# Patient Record
Sex: Female | Born: 1975 | Hispanic: No | Smoking: Current every day smoker
Health system: Southern US, Community
[De-identification: ages and names within clinical notes are randomized; demographics above are authoritative.]

## PROBLEM LIST (undated history)

## (undated) DIAGNOSIS — F419 Anxiety disorder, unspecified: Secondary | ICD-10-CM

## (undated) DIAGNOSIS — R42 Dizziness and giddiness: Secondary | ICD-10-CM

## (undated) DIAGNOSIS — R569 Unspecified convulsions: Secondary | ICD-10-CM

## (undated) DIAGNOSIS — F32A Depression, unspecified: Secondary | ICD-10-CM

## (undated) HISTORY — DX: Anxiety disorder, unspecified: F41.9

## (undated) HISTORY — PX: TUBAL LIGATION: SHX77

## (undated) HISTORY — PX: ESOPHAGUS SURGERY: SHX626

## (undated) HISTORY — DX: Unspecified convulsions: R56.9

## (undated) HISTORY — DX: Depression, unspecified: F32.A

## (undated) HISTORY — PX: FOOT SURGERY: SHX648

---

## 2021-01-11 DIAGNOSIS — Z720 Tobacco use: Secondary | ICD-10-CM | POA: Insufficient documentation

## 2021-01-11 DIAGNOSIS — F319 Bipolar disorder, unspecified: Secondary | ICD-10-CM | POA: Insufficient documentation

## 2021-01-11 DIAGNOSIS — F1920 Other psychoactive substance dependence, uncomplicated: Secondary | ICD-10-CM | POA: Insufficient documentation

## 2021-01-11 DIAGNOSIS — R569 Unspecified convulsions: Secondary | ICD-10-CM | POA: Insufficient documentation

## 2021-04-28 ENCOUNTER — Encounter (HOSPITAL_COMMUNITY): Payer: Self-pay

## 2021-04-28 ENCOUNTER — Emergency Department (HOSPITAL_COMMUNITY)
Admission: EM | Admit: 2021-04-28 | Discharge: 2021-04-28 | Disposition: A | Discharge status: Home / Self Care | Payer: PRIVATE HEALTH INSURANCE | Attending: Emergency Medicine | Admitting: Emergency Medicine

## 2021-04-28 ENCOUNTER — Other Ambulatory Visit (HOSPITAL_COMMUNITY): Payer: Self-pay

## 2021-04-28 DIAGNOSIS — Y990 Civilian activity done for income or pay: Secondary | ICD-10-CM | POA: Diagnosis not present

## 2021-04-28 DIAGNOSIS — M79662 Pain in left lower leg: Secondary | ICD-10-CM | POA: Diagnosis not present

## 2021-04-28 DIAGNOSIS — W010XXA Fall on same level from slipping, tripping and stumbling without subsequent striking against object, initial encounter: Secondary | ICD-10-CM | POA: Diagnosis not present

## 2021-04-28 DIAGNOSIS — M7918 Myalgia, other site: Secondary | ICD-10-CM | POA: Diagnosis not present

## 2021-04-28 DIAGNOSIS — W19XXXA Unspecified fall, initial encounter: Secondary | ICD-10-CM

## 2021-04-28 DIAGNOSIS — R52 Pain, unspecified: Secondary | ICD-10-CM | POA: Diagnosis present

## 2021-04-28 MED ORDER — IBUPROFEN 600 MG PO TABS
600.0000 mg | ORAL_TABLET | Freq: Four times a day (QID) | ORAL | 0 refills | Status: DC | PRN
  Filled 2021-04-28: qty 30, 8d supply, fill #0

## 2021-04-28 MED ORDER — LIDOCAINE 5 % EX PTCH
1.0000 | MEDICATED_PATCH | CUTANEOUS | 0 refills | Status: DC
  Filled 2021-04-28: qty 30, 30d supply, fill #0

## 2021-04-28 NOTE — Discharge Instructions (Signed)
Use the ibuprofen as needed for pain and inflammation.  You may also use Tylenol.  Lidocaine patches have been sent to the pharmacy as well.  All of these may be purchased over-the-counter in the event that your insurance does not cover them.  Your work note is attached.

## 2021-04-28 NOTE — ED Triage Notes (Signed)
Pt arrived via POV, c/o slip and fall at workplace. C/o left shoulder and left leg pain. Ambulatory in triage.

## 2021-04-28 NOTE — ED Provider Notes (Signed)
[facility]-EMERGENCY DEPT Provider Note   CSN: [id number] Arrival date & time: 04/28/21  1003     History  Chief Complaint  Patient presents with   Lacey Miles Lacey Miles is a 46 y.o. female presenting after slip and fall yesterday at work.  Patient is a Scientist, research (life sciences) and someone was mopping the floor and did not put a wet floor side and the patient ended up slipping and falling onto her back and bottom.  Denies hitting her head or losing consciousness.  Was ambulatory after the event.  Denies any numbness or tingling.  Complaining of pain in the left side of her body.  Concerned about being able to work.  Has not taken any medications for this      Home Medications Prior to Admission medications   Medication Sig Start Date End Date Taking? Authorizing Provider  ibuprofen (ADVIL) 600 MG tablet Take 1 tablet (600 mg total) by mouth every 6 (six) hours as needed. 04/28/21  Yes Laqueta Bonaventura A, PA-C  lidocaine (LIDODERM) 5 % Place 1 patch onto the skin daily. Remove & Discard patch within 12 hours or as directed by MD 04/28/21  Yes Johnnetta Holstine A, PA-C      Allergies    Patient has no allergy information on record.    Review of Systems   Review of Systems  Physical Exam Updated Vital Signs BP 118/76 (BP Location: Right Arm)    Pulse 76    Temp (!) 97.4 F (36.3 C) (Oral)    Resp 16    SpO2 100%  Physical Exam Vitals and nursing note reviewed.  Constitutional:      Appearance: Normal appearance.  HENT:     Head: Normocephalic and atraumatic.  Eyes:     General: No scleral icterus.    Conjunctiva/sclera: Conjunctivae normal.  Pulmonary:     Effort: Pulmonary effort is normal. No respiratory distress.  Musculoskeletal:        General: Tenderness (Tenderness localized to quadriceps, hamstrings and glutes of left lower extremity.  No tenderness over the knee or over the hip.  No tenderness to the left shoulder, elbow or wrist.  Patient  was able to stand and ambulate, had somewhat of a limp.) present. No swelling, deformity or signs of injury.  Skin:    General: Skin is warm and dry.     Findings: No rash.  Neurological:     Mental Status: She is alert.  Psychiatric:        Mood and Affect: Mood normal.    ED Results / Procedures / Treatments   Labs (all labs ordered are listed, but only abnormal results are displayed) Labs Reviewed - No data to display  EKG None  Radiology No results found.  Procedures Procedures   Medications Ordered in ED Medications - No data to display  ED Course/ Medical Decision Making/ A&P                           Medical Decision Making Risk Prescription drug management.   46 year old female presenting after a fall yesterday.  She reports that she fell onto her back and left side.  Complaining of left extremity pain.  On physical exam, all tenderness was localized to the muscles.  No bony tenderness, no x-rays needed at this time.  We talked about appropriate over-the-counter treatment.  I have sent 600 mg ibuprofens to her pharmacy for her  to use if needed.  Lidocaine patches will also be there.  Patient requesting work note, this is attached to her discharge papers.   Final Clinical Impression(s) / ED Diagnoses Final diagnoses:  Fall, initial encounter    Rx / DC Orders ED Discharge Orders          Ordered    lidocaine (LIDODERM) 5 %  Every 24 hours        04/28/21 1214    ibuprofen (ADVIL) 600 MG tablet  Every 6 hours PRN        04/28/21 1214           Results and diagnoses were explained to the patient. Return precautions discussed in full. Patient had no additional questions and expressed complete understanding.   This chart was dictated using voice recognition software.  Despite best efforts to proofread,  errors can occur which can change the documentation meaning.    Woodroe Chen 04/28/21 1240    Jacalyn Lefevre, MD 04/28/21 1505

## 2021-07-20 ENCOUNTER — Encounter: Payer: Self-pay | Admitting: Nurse Practitioner

## 2021-07-20 ENCOUNTER — Ambulatory Visit (INDEPENDENT_AMBULATORY_CARE_PROVIDER_SITE_OTHER): Payer: 59 | Admitting: Nurse Practitioner

## 2021-07-20 VITALS — BP 90/58 | HR 98 | Temp 98.0°F | Resp 12 | Ht 70.0 in | Wt 137.4 lb

## 2021-07-20 DIAGNOSIS — F319 Bipolar disorder, unspecified: Secondary | ICD-10-CM | POA: Diagnosis not present

## 2021-07-20 DIAGNOSIS — F1920 Other psychoactive substance dependence, uncomplicated: Secondary | ICD-10-CM

## 2021-07-20 DIAGNOSIS — Z720 Tobacco use: Secondary | ICD-10-CM | POA: Diagnosis not present

## 2021-07-20 DIAGNOSIS — G43109 Migraine with aura, not intractable, without status migrainosus: Secondary | ICD-10-CM

## 2021-07-20 DIAGNOSIS — F419 Anxiety disorder, unspecified: Secondary | ICD-10-CM | POA: Insufficient documentation

## 2021-07-20 DIAGNOSIS — R569 Unspecified convulsions: Secondary | ICD-10-CM

## 2021-07-20 LAB — POCT URINALYSIS DIPSTICK
Bilirubin, UA: NEGATIVE
Blood, UA: NEGATIVE
Glucose, UA: NEGATIVE
Ketones, UA: NEGATIVE
Leukocytes, UA: NEGATIVE
Nitrite, UA: NEGATIVE
Protein, UA: NEGATIVE
Spec Grav, UA: 1.015 (ref 1.010–1.025)
Urobilinogen, UA: 0.2 E.U./dL
pH, UA: 7.5 (ref 5.0–8.0)

## 2021-07-20 MED ORDER — SUMATRIPTAN SUCCINATE 25 MG PO TABS: 25.0000 mg | ORAL_TABLET | Freq: Once | ORAL | 0 refills | Status: DC

## 2021-07-20 MED ORDER — HYDROXYZINE HCL 10 MG PO TABS: 10.0000 mg | ORAL_TABLET | Freq: Three times a day (TID) | ORAL | 0 refills | Status: DC | PRN

## 2021-07-20 NOTE — Assessment & Plan Note (Signed)
Patient currently not managed by any behavioral health specialist.  Did change her hydroxyzine from 50 mg 4 times daily to 10 mg 3 times daily as patient states she cannot tolerate the amount of hydroxyzine.  We will have her referred to psychiatry for further evaluation and follow-up with her bipolar and possible schizophrenia. ?

## 2021-07-20 NOTE — Patient Instructions (Signed)
Nice to see you today  ?I want to see you in 4-5 months for your physical ?I will be in touch with the lab results ?I also placed the referrals that we talked about ?

## 2021-07-20 NOTE — Assessment & Plan Note (Signed)
History of the same per last ER visit was on Keppra 750 mg twice daily and Depakote 500 mg extended release twice daily patient also on topiramate 50 mg twice daily.  States she does have a medication list but is concerned this could expire soon will have an urgent referral to neurology for further management last seizure was approximately 2 months ago.  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 ?

## 2021-07-20 NOTE — Assessment & Plan Note (Signed)
Cervical diagnosis.  Patient states that she is on Topamax for seizures not headache prophylaxis.  Does have a history of using sumatriptan hand for abortive therapy.  Patient is also on medication.  Will renew sumatriptan hand for patient and this can also be evaluated by neurology along with her seizure disorder. ?

## 2021-07-20 NOTE — Assessment & Plan Note (Signed)
PHQ-9 and GAD-7 administered in office.  GAD-7 score was elevated at 17.  Patient does have a duloxetine but states she cannot tolerate 50 mg 4 times a day will lower the dose hydroxyzine 10 mg 3 times daily as needed referral for psychiatry placed ?

## 2021-07-20 NOTE — Assessment & Plan Note (Signed)
Currently everyday smoker.  She is interested in quitting did provide her with 1 800 quit now information on discharge paperwork. ?

## 2021-07-20 NOTE — Progress Notes (Signed)
? ?New Patient Office Visit ? ?Subjective   ? ?Patient ID: Lacey Miles, female    DOB: 07/02/1975  Age: 46 y.o. MRN: [id number] ? ?CC:  ?Chief Complaint  ?Patient presents with  ? Establish Care  ?  Previous PCP [facility] [place], and also [facility]  in [place], [facility].  ? ? ?HPI ?Lacey Miles presents to establish care ? ? ?Seizure: states that she has not seen a neurology in approx 1 year.  States the last time she had a seizure was approx approx 2 months. She would stretch medication because of cost. Started after her head went through the window ? ?Bipolar 1: use to see [facility] in the past. States that she used to be followed and approx last time was 1 year ago. States that she ?No Hi/Si/AVH. History of visual hallucinationa dn auditory hallicunations. States that she will hear mumbling voices but no command.  ?States that she sees ghost, but is unsure if that is something that other people can do or see if that is part of her hallucinations. ? ?Polysubstance: would smoke and snort crack/cocaine and drink a little alocohol a the time.  States she has been sober for 1 year.  She is not in any support group such as NA.  Commended her on her sobriety ? ?Tobacco: States that she has tried cold [place] in the past without help. Has not tired other methods ? ?Immunizations: ?-Tetanus: up to date ?-Influenza: out of season ?-Covid-19: Up to date ?-Shingles: Too young ?-Pneumonia: Too young ? ?Diet: Fair diet. States she eats all the time. Snacks. All but coffee. ?Exercise: No regular exercise. House hold and yard work ? ?Eye exam: needs updating ?Dental exam: needs updating ? ?Pap Smear: DUE ?Mammogram: DUE ?Colonoscopy: Too young ?Dexa: Too young ? ? ?LMP: having a heavier period and becoming irregular. ? ?Lung Cancer Screening: NA ? ?Headaches: left side of head. States that it has been daily this past day. States that tylenol came  make it go away. Light and sound and n/v vision goes "[place]" ? ?Sleep: states will go to bed around 12am and get up around 6am. Does not feel rested. Does snore ? ?Outpatient Encounter Medications as of 07/20/2021  ?Medication Sig  ? divalproex (DEPAKOTE ER) 500 MG 24 hr tablet Take 500 mg by mouth daily.  ? hydrOXYzine (ATARAX) 10 MG tablet Take 1 tablet (10 mg total) by mouth 3 (three) times daily as needed.  ? levETIRAcetam (KEPPRA) 750 MG tablet Take 750 mg by mouth 2 (two) times daily.  ? topiramate (TOPAMAX) 50 MG tablet Take 50 mg by mouth daily.  ? traZODone (DESYREL) 100 MG tablet Take 1 tablet by mouth at bedtime.  ? [DISCONTINUED] hydrOXYzine (VISTARIL) 50 MG capsule Take 50 mg by mouth 4 (four) times daily as needed.  ? [DISCONTINUED] SUMAtriptan (IMITREX) 25 MG tablet Take by mouth.  ? SUMAtriptan (IMITREX) 25 MG tablet Take 1 tablet (25 mg total) by mouth once for 1 dose. May repeat in 2 hours if needed  ? [DISCONTINUED] ibuprofen (ADVIL) 600 MG tablet Take 1 tablet (600 mg total) by mouth every 6 (six) hours as needed.  ? [DISCONTINUED] lidocaine (LIDODERM) 5 % Place 1 patch onto the skin daily. Remove & Discard patch within 12 hours or as directed by MD  ? ?No facility-administered encounter medications on file as of 07/20/2021.  ? ? ?Past Medical History:  ?Diagnosis Date  ? Anxiety   ?  Depression   ? Seizures (HCC)   ? ? ?Past Surgical History:  ?Procedure Laterality Date  ? ESOPHAGUS SURGERY    ? as a baby, due to esophagus was very tiny-had to put an artifical part in esophagus so it would stay open  ? FOOT SURGERY    ? as a baby-had to break bones in legs to fix her feet/legs. legs were bended inwards  ? TUBAL LIGATION    ? ? ?Family History  ?Problem Relation Age of Onset  ? Heart disease Mother   ? Stroke Mother   ? Diabetes Mother   ? Asthma Father   ? Heart disease Father   ?     open heart surgery  ? Stroke Father   ? Diabetes Father   ? Asthma Brother   ? Brain cancer Maternal Grandmother    ? Emphysema Maternal Grandfather   ? Cancer Paternal Grandmother   ?     unsure of the type  ? ? ?Social History  ? ?Socioeconomic History  ? Marital status: Divorced  ?  Spouse name: Not on file  ? Number of children: 3  ? Years of education: Not on file  ? Highest education level: Not on file  ?Occupational History  ? Not on file  ?Tobacco Use  ? Smoking status: Every Day  ?  Packs/day: 0.50  ?  Years: 30.00  ?  Pack years: 15.00  ?  Types: Cigarettes  ? Smokeless tobacco: Never  ?Vaping Use  ? Vaping Use: Former  ?Substance and Sexual Activity  ? Alcohol use: Not Currently  ? Drug use: Not Currently  ?  Types: "Crack" cocaine  ?  Comment: quit in 2022  ? Sexual activity: Not on file  ?Other Topics Concern  ? Not on file  ?Social History Narrative  ? Fulltime: Conservation officer, nature for [employer]  ? ?Social Determinants of Health  ? ?Financial Resource Strain: Not on file  ?Food Insecurity: Not on file  ?Transportation Needs: Not on file  ?Physical Activity: Not on file  ?Stress: Not on file  ?Social Connections: Not on file  ?Intimate Partner Violence: Not on file  ? ? ?Review of Systems  ?Constitutional:  Positive for malaise/fatigue. Negative for chills and fever.  ?Respiratory:  Negative for shortness of breath (DOE with smoking).   ?Cardiovascular:  Negative for chest pain and leg swelling.  ?Gastrointestinal:  Negative for abdominal pain, diarrhea, nausea and vomiting.  ?     BM daily ?  ?Genitourinary:  Negative for dysuria and hematuria.  ?     Nocturia intermittent  ?Neurological:  Positive for seizures and headaches. Negative for tingling.  ? ?  ? ? ?Objective   ? ?BP (!) 90/58   Pulse 98   Temp 98 ?F (36.7 ?C)   Resp 12   Ht 5\' 10"  (1.778 m)   Wt 137 lb 6 oz (62.3 kg)   LMP 07/17/2021   SpO2 97%   BMI 19.71 kg/m?  ? ?Physical Exam ?Vitals and nursing note reviewed.  ?Constitutional:   ?   Appearance: Normal appearance.  ?HENT:  ?   Right Ear: Tympanic membrane, ear canal and external ear normal.  ?   Left  Ear: Tympanic membrane, ear canal and external ear normal.  ?Cardiovascular:  ?   Rate and Rhythm: Normal rate and regular rhythm.  ?   Pulses: Normal pulses.  ?   Heart sounds: Normal heart sounds.  ?Pulmonary:  ?   Effort: Pulmonary  effort is normal.  ?   Breath sounds: Normal breath sounds.  ?Abdominal:  ?   General: Bowel sounds are normal.  ?   Tenderness: There is abdominal tenderness.  ?Musculoskeletal:  ?   Right lower leg: No edema.  ?   Left lower leg: No edema.  ?Lymphadenopathy:  ?   Cervical: No cervical adenopathy.  ?Skin: ?   General: Skin is warm.  ?Neurological:  ?   General: No focal deficit present.  ?   Mental Status: She is alert.  ?   Deep Tendon Reflexes:  ?   Reflex Scores: ?     Bicep reflexes are 2+ on the right side and 2+ on the left side. ?     Patellar reflexes are 2+ on the right side and 2+ on the left side. ?   Comments: Bilateral upper and lower extremity strength 5/5  ?Psychiatric:     ?   Mood and Affect: Mood normal.     ?   Behavior: Behavior normal.     ?   Thought Content: Thought content normal.     ?   Judgment: Judgment normal.  ? ? ? ?  ? ?Assessment & Plan:  ? ?Problem List Items Addressed This Visit   ? ?  ? Cardiovascular and Mediastinum  ? Migraine with aura and without status migrainosus, not intractable  ?  Cervical diagnosis.  Patient states that she is on Topamax for seizures not headache prophylaxis.  Does have a history of using sumatriptan hand for abortive therapy.  Patient is also on medication.  Will renew sumatriptan hand for patient and this can also be evaluated by neurology along with her seizure disorder. ? ?  ?  ? Relevant Medications  ? divalproex (DEPAKOTE ER) 500 MG 24 hr tablet  ? levETIRAcetam (KEPPRA) 750 MG tablet  ? topiramate (TOPAMAX) 50 MG tablet  ? traZODone (DESYREL) 100 MG tablet  ? SUMAtriptan (IMITREX) 25 MG tablet  ?  ? Other  ? Bipolar 1 disorder (HCC) - Primary  ?  Patient currently not managed by any behavioral health specialist.   Did change her hydroxyzine from 50 mg 4 times daily to 10 mg 3 times daily as patient states she cannot tolerate the amount of hydroxyzine.  We will have her referred to psychiatry for further evaluation and follow-u

## 2021-07-20 NOTE — Assessment & Plan Note (Signed)
Patient reports that she used to use crack and cocaine.  States she has been sober for the past year ?

## 2021-07-21 LAB — LIPID PANEL
Cholesterol: 143 mg/dL (ref <200)
HDL: 45 mg/dL — ABNORMAL LOW (ref >=50)
LDL Cholesterol (Calc): 71 mg/dL (calc)
Non-HDL Cholesterol (Calc): 98 mg/dL (calc) (ref <130)
Total CHOL/HDL Ratio: 3.2 (calc) (ref <5.0)
Triglycerides: 199 mg/dL — ABNORMAL HIGH (ref <150)

## 2021-07-21 LAB — CBC WITH DIFFERENTIAL/PLATELET
Absolute Monocytes: 545 cells/uL (ref 200–950)
Basophils Absolute: 59 cells/uL (ref 0–200)
Basophils Relative: 0.6 %
Eosinophils Absolute: 644 cells/uL — ABNORMAL HIGH (ref 15–500)
Eosinophils Relative: 6.5 %
HCT: 40.1 % (ref 35.0–45.0)
Hemoglobin: 13.3 g/dL (ref 11.7–15.5)
Lymphs Abs: 2861 cells/uL (ref 850–3900)
MCH: 31.1 pg (ref 27.0–33.0)
MCHC: 33.2 g/dL (ref 32.0–36.0)
MCV: 93.9 fL (ref 80.0–100.0)
MPV: 13.9 fL — ABNORMAL HIGH (ref 7.5–12.5)
Monocytes Relative: 5.5 %
Neutro Abs: 5792 cells/uL (ref 1500–7800)
Neutrophils Relative %: 58.5 %
Platelets: 146 10*3/uL (ref 140–400)
RBC: 4.27 10*6/uL (ref 3.80–5.10)
RDW: 13.4 % (ref 11.0–15.0)
Total Lymphocyte: 28.9 %
WBC: 9.9 10*3/uL (ref 3.8–10.8)

## 2021-07-21 LAB — COMPREHENSIVE METABOLIC PANEL
AG Ratio: 1.9 (calc) (ref 1.0–2.5)
ALT: 9 U/L (ref 6–29)
AST: 9 U/L — ABNORMAL LOW (ref 10–30)
Albumin: 4.3 g/dL (ref 3.6–5.1)
Alkaline phosphatase (APISO): 64 U/L (ref 31–125)
BUN: 11 mg/dL (ref 7–25)
CO2: 23 mmol/L (ref 20–32)
Calcium: 10.3 mg/dL — ABNORMAL HIGH (ref 8.6–10.2)
Chloride: 107 mmol/L (ref 98–110)
Creat: 0.91 mg/dL (ref 0.50–0.99)
Globulin: 2.3 g/dL (calc) (ref 1.9–3.7)
Glucose, Bld: 80 mg/dL (ref 65–99)
Potassium: 4.3 mmol/L (ref 3.5–5.3)
Sodium: 140 mmol/L (ref 135–146)
Total Bilirubin: 0.3 mg/dL (ref 0.2–1.2)
Total Protein: 6.6 g/dL (ref 6.1–8.1)

## 2021-07-21 LAB — TSH: TSH: 0.86 mIU/L

## 2021-07-24 ENCOUNTER — Telehealth: Payer: Self-pay

## 2021-07-24 NOTE — Telephone Encounter (Signed)
See lab results notes. I  called again but voicemail is not set up.  ?

## 2021-07-24 NOTE — Telephone Encounter (Signed)
[facility] Night - Client ?Nonclinical Telephone Record  ?AccessNurse? ?Client [facility] Night - Client ?Client Site [facility] - Night ?Provider AA - PHYSICIAN, UNKNOWN- MD ?Contact Type Call ?Who Is Calling Patient / Member / Family / Caregiver ?Caller Name Lacey Miles ?Caller Phone Number [phone] ?Call Type Message Only Information Provided ?Reason for Call Returning a Call from the Office ?Initial Comment Caller states she had a missed call from the office today. She is a new pt so she isn't sure of the ?doctors name. Address was verified. ?Additional Comment Caller is returning had a missed call today while she was at work. ?Disp. Time Disposition Final User ?07/23/2021 5:06:28 PM General Information Provided Yes Baker Pierini ?Call Closed By: Baker Pierini ?Transaction Date/Time: 07/23/2021 5:03:45 PM (ET ?

## 2021-07-27 ENCOUNTER — Ambulatory Visit (INDEPENDENT_AMBULATORY_CARE_PROVIDER_SITE_OTHER): Payer: 59 | Admitting: Family

## 2021-07-27 ENCOUNTER — Encounter: Payer: Self-pay | Admitting: Neurology

## 2021-07-27 ENCOUNTER — Encounter: Payer: Self-pay | Admitting: Family

## 2021-07-27 VITALS — BP 112/68 | HR 73 | Temp 98.3°F | Resp 16 | Ht 70.0 in | Wt 137.4 lb

## 2021-07-27 DIAGNOSIS — J0110 Acute frontal sinusitis, unspecified: Secondary | ICD-10-CM | POA: Insufficient documentation

## 2021-07-27 DIAGNOSIS — R350 Frequency of micturition: Secondary | ICD-10-CM | POA: Insufficient documentation

## 2021-07-27 DIAGNOSIS — Z20822 Contact with and (suspected) exposure to covid-19: Secondary | ICD-10-CM

## 2021-07-27 DIAGNOSIS — H8111 Benign paroxysmal vertigo, right ear: Secondary | ICD-10-CM

## 2021-07-27 DIAGNOSIS — R0789 Other chest pain: Secondary | ICD-10-CM | POA: Diagnosis not present

## 2021-07-27 DIAGNOSIS — R12 Heartburn: Secondary | ICD-10-CM | POA: Diagnosis not present

## 2021-07-27 DIAGNOSIS — R5383 Other fatigue: Secondary | ICD-10-CM

## 2021-07-27 DIAGNOSIS — R42 Dizziness and giddiness: Secondary | ICD-10-CM

## 2021-07-27 LAB — POCT URINALYSIS DIP (CLINITEK)
Bilirubin, UA: NEGATIVE
Blood, UA: NEGATIVE
Glucose, UA: NEGATIVE mg/dL
Ketones, POC UA: NEGATIVE mg/dL
Nitrite, UA: NEGATIVE
POC PROTEIN,UA: NEGATIVE
Spec Grav, UA: 1.01 (ref 1.010–1.025)
Urobilinogen, UA: 0.2 E.U./dL
pH, UA: 8 (ref 5.0–8.0)

## 2021-07-27 LAB — POC COVID19 BINAXNOW: SARS Coronavirus 2 Ag: NEGATIVE

## 2021-07-27 MED ORDER — OMEPRAZOLE 20 MG PO CPDR: 20.0000 mg | DELAYED_RELEASE_CAPSULE | Freq: Every day | ORAL | 3 refills | Status: DC

## 2021-07-27 MED ORDER — MECLIZINE HCL 25 MG PO TABS: ORAL_TABLET | ORAL | 0 refills | Status: DC

## 2021-07-27 MED ORDER — AMOXICILLIN-POT CLAVULANATE 875-125 MG PO TABS: 1.0000 | ORAL_TABLET | Freq: Two times a day (BID) | ORAL | 0 refills | Status: DC

## 2021-07-27 NOTE — Assessment & Plan Note (Signed)
Rise slowly upon standing or sitting up  rx meclizine 25 mg, take 1/2 to 1 tablet qhs prn vertigo If no improvement x one week consider referral to ent for epley maneuver Handout given on vertigo to pt and sent via mychart  

## 2021-07-27 NOTE — Patient Instructions (Signed)
It was a pleasure seeing you today.   You were found to have a urinary tract infection, you have been prescribed an antibiotic to your preferred pharmacy. Please start antibiotic today as directed.   We are sending your urine for a culture to make sure you do not have a resistant bacteria. We will call you if we need to change your medications.   Please make sure you are drinking plenty of fluids over the next few days.  If your symptoms do not improve over the next 5-7 days, or if they worsen, please let us know. Please also let us know if you have worsening back pain, fevers, chills, or body aches.   Regards,   Daymon Hora  

## 2021-07-27 NOTE — Assessment & Plan Note (Addendum)
covid negative ?Orthostatic there is drop of blood pressure/pulse , drop of systolic 20 mmhg however ?Pulse did increase upon standing from 68 to 96 bpm ?Ordering cbc cmp tsh b12  ?Suspected sinusitis ?

## 2021-07-27 NOTE — Progress Notes (Signed)
? ?Established Patient Office Visit ? ?Subjective:  ?Patient ID: Lacey Miles, female    DOB: [birth year]  Age: 46 y.o. MRN: [id number] ? ?CC:  ?Chief Complaint  ?Patient presents with  ?? Dizziness  ?  Started this am   ?? Nausea  ?? Urinary Frequency  ? ? ?HPI ?Lacey Miles is here today with concerns.  ? ?This am woke up with headache, not feeling well.  ?Dizzy upon standing, has had h/o vertigo.  ?No sore throat. No cough . No chest congestion.  ?No fever no chills.  ?No diarrhea. ?Some epigastric pain, maybe heart burn, sharp pain intermittent.  ? ?Also peeing more frequently over the last two days.  ?No dysuria.  ?Also with urinary urgency. No flank pain.  ? ?Past Medical History:  ?Diagnosis Date  ?? Anxiety   ?? Depression   ?? Seizures (HCC)   ? ? ?Past Surgical History:  ?Procedure Laterality Date  ?? ESOPHAGUS SURGERY    ? as a baby, due to esophagus was very tiny-had to put an artifical part in esophagus so it would stay open  ?? FOOT SURGERY    ? as a baby-had to break bones in legs to fix her feet/legs. legs were bended inwards  ?? TUBAL LIGATION    ? ? ?Family History  ?Problem Relation Age of Onset  ?? Heart disease Mother   ?? Stroke Mother   ?? Diabetes Mother   ?? Asthma Father   ?? Heart disease Father   ?     open heart surgery  ?? Stroke Father   ?? Diabetes Father   ?? Asthma Brother   ?? Brain cancer Maternal Grandmother   ?? Emphysema Maternal Grandfather   ?? Cancer Paternal Grandmother   ?     unsure of the type  ? ? ?Social History  ? ?Socioeconomic History  ?? Marital status: Divorced  ?  Spouse name: Not on file  ?? Number of children: 3  ?? Years of education: Not on file  ?? Highest education level: Not on file  ?Occupational History  ?? Not on file  ?Tobacco Use  ?? Smoking status: Every Day  ?  Packs/day: 0.50  ?  Years: 30.00  ?  Pack years: 15.00  ?  Types: Cigarettes  ?? Smokeless tobacco: Never  ?Vaping Use  ?? Vaping Use: Former  ?Substance and Sexual Activity   ?? Alcohol use: Not Currently  ?? Drug use: Not Currently  ?  Types: "Crack" cocaine  ?  Comment: quit in 2022  ?? Sexual activity: Not on file  ?Other Topics Concern  ?? Not on file  ?Social History Narrative  ? Fulltime: Conservation officer, nature for [employer]  ? ?Social Determinants of Health  ? ?Financial Resource Strain: Not on file  ?Food Insecurity: Not on file  ?Transportation Needs: Not on file  ?Physical Activity: Not on file  ?Stress: Not on file  ?Social Connections: Not on file  ?Intimate Partner Violence: Not on file  ? ? ?Outpatient Medications Prior to Visit  ?Medication Sig Dispense Refill  ?? divalproex (DEPAKOTE ER) 500 MG 24 hr tablet Take 500 mg by mouth daily.    ?? levETIRAcetam (KEPPRA) 750 MG tablet Take 750 mg by mouth 2 (two) times daily.    ?? topiramate (TOPAMAX) 50 MG tablet Take 50 mg by mouth daily.    ?? traZODone (DESYREL) 100 MG tablet Take 1 tablet by mouth at bedtime.    ?? SUMAtriptan (IMITREX) 25 MG  tablet Take 1 tablet (25 mg total) by mouth once for 1 dose. May repeat in 2 hours if needed 10 tablet 0  ?? hydrOXYzine (ATARAX) 10 MG tablet Take 1 tablet (10 mg total) by mouth 3 (three) times daily as needed. (Patient not taking: Reported on 07/27/2021) 30 tablet 0  ? ?No facility-administered medications prior to visit.  ? ? ?Allergies  ?Allergen Reactions  ?? Minocycline Anaphylaxis  ?? Tomato Rash  ?? Trazodone And Nefazodone Anaphylaxis  ?? Tramadol   ? ? ?ROS ?Review of Systems  ?Constitutional:  Negative for chills, fatigue and fever.  ?HENT:  Positive for ear pain (left). Negative for congestion, postnasal drip, rhinorrhea, sinus pressure, sinus pain, sneezing and sore throat.   ?Eyes:  Negative for discharge.  ?Respiratory:  Negative for cough, chest tightness, shortness of breath and wheezing.   ?Cardiovascular:  Positive for chest pain. Negative for palpitations and leg swelling.  ?Gastrointestinal:  Positive for abdominal pain (epigastric pain with some heart burn).  ?Neurological:   Positive for dizziness and headaches.  ? ?  ?Objective:  ?  ?Orthostatic  ?Sitting , 113/68, p 68 ?Lying , 109/67, p 67 ?Standing , 102/77, p96 ? ?Covid negative  ? ?Physical Exam ?Constitutional:   ?   General: She is not in acute distress. ?   Appearance: Normal appearance. She is normal weight. She is not ill-appearing.  ?HENT:  ?   Right Ear: A middle ear effusion (clear) is present.  ?   Left Ear: Tympanic membrane is retracted.  ?   Nose: Nose normal. No congestion or rhinorrhea.  ?   Right Turbinates: Not enlarged or swollen.  ?   Left Turbinates: Not enlarged or swollen.  ?   Right Sinus: No maxillary sinus tenderness or frontal sinus tenderness.  ?   Left Sinus: No maxillary sinus tenderness or frontal sinus tenderness.  ?   Mouth/Throat:  ?   Mouth: Mucous membranes are moist.  ?   Pharynx: No pharyngeal swelling, oropharyngeal exudate or posterior oropharyngeal erythema.  ?   Tonsils: No tonsillar exudate.  ?Eyes:  ?   Extraocular Movements: Extraocular movements intact.  ?   Conjunctiva/sclera: Conjunctivae normal.  ?   Pupils: Pupils are equal, round, and reactive to light.  ?Neck:  ?   Thyroid: No thyroid mass.  ?Cardiovascular:  ?   Rate and Rhythm: Normal rate and regular rhythm.  ?Pulmonary:  ?   Effort: Pulmonary effort is normal.  ?   Breath sounds: Normal breath sounds.  ?Abdominal:  ?   Tenderness: There is abdominal tenderness (epigastric).  ?Lymphadenopathy:  ?   Cervical:  ?   Right cervical: No superficial cervical adenopathy. ?   Left cervical: No superficial cervical adenopathy.  ?Neurological:  ?   Mental Status: She is alert.  ? ? ?BP 112/68   Pulse 73   Temp 98.3 ?F (36.8 ?C)   Resp 16   Ht 5\' 10"  (1.778 m)   Wt 137 lb 7 oz (62.3 kg)   LMP 07/17/2021   SpO2 98%   BMI 19.72 kg/m?  ?Wt Readings from Last 3 Encounters:  ?07/27/21 137 lb 7 oz (62.3 kg)  ?07/20/21 137 lb 6 oz (62.3 kg)  ? ? ? ?Health Maintenance Due  ?Topic Date Due  ?? HIV Screening  Never done  ?? Hepatitis C  Screening  Never done  ?? TETANUS/TDAP  Never done  ?? PAP SMEAR-Modifier  Never done  ?? COVID-19 Vaccine (3 -  Pf[employer] risk series) 12/23/2019  ? ? ?There are no preventive care reminders to display for this patient. ? ?Lab Results  ?Component Value Date  ? TSH 0.86 07/20/2021  ? ?Lab Results  ?Component Value Date  ? WBC 9.9 07/20/2021  ? HGB 13.3 07/20/2021  ? HCT 40.1 07/20/2021  ? MCV 93.9 07/20/2021  ? PLT 146 07/20/2021  ? ?Lab Results  ?Component Value Date  ? NA 140 07/20/2021  ? K 4.3 07/20/2021  ? CO2 23 07/20/2021  ? GLUCOSE 80 07/20/2021  ? BUN 11 07/20/2021  ? CREATININE 0.91 07/20/2021  ? BILITOT 0.3 07/20/2021  ? AST 9 (L) 07/20/2021  ? ALT 9 07/20/2021  ? PROT 6.6 07/20/2021  ? CALCIUM 10.3 (H) 07/20/2021  ? ?No results found for: HGBA1C ? ?  ?Assessment & Plan:  ? ?Problem List Items Addressed This Visit   ? ?  ? Respiratory  ? Acute non-recurrent frontal sinusitis  ?  Prescription given for augmentin 875/125 mg po bid for ten days. Pt to continue tylenol/ibuprofen prn sinus pain. Continue with humidifier prn and steam showers recommended as well. instructed If no symptom improvement in 48 hours please f/u ? ? ? ?  ?  ? Relevant Medications  ? amoxicillin-clavulanate (AUGMENTIN) 875-125 MG tablet  ?  ? Nervous and Auditory  ? Benign paroxysmal positional vertigo of right ear  ?  Rise slowly upon standing or sitting up  ?rx meclizine 25 mg, take 1/2 to 1 tablet qhs prn vertigo ?If no improvement x one week consider referral to ent for epley maneuver ?Handout given on vertigo to pt and sent via mychart ? ?  ?  ? Relevant Medications  ? meclizine (ANTIVERT) 25 MG tablet  ? amoxicillin-clavulanate (AUGMENTIN) 875-125 MG tablet  ?  ? Other  ? Other chest pain - Primary  ?  ekg today ?NSR no acute changes ? ?  ?  ? Relevant Orders  ? EKG 12-Lead (Completed)  ? Suspected COVID-19 virus infection  ?  covid test today in office, negative ? ?  ?  ? Relevant Orders  ? POC COVID-19 BinaxNow (Completed)  ?  Dizziness  ?  covid negative ?Orthostatic there is drop of blood pressure/pulse , drop of systolic 20 mmhg however ?Pulse did increase upon standing from 68 to 96 bpm ?Ordering cbc cmp tsh b12  ?Suspected sinusitis ? ?  ?

## 2021-07-27 NOTE — Assessment & Plan Note (Signed)
ekg today ?NSR no acute changes ?

## 2021-07-27 NOTE — Assessment & Plan Note (Signed)
Trial omeprazole 20 mg once daily Try to decrease and or avoid spicy foods, fried fatty foods, and also caffeine and chocolate as these can increase heartburn symptoms.   

## 2021-07-27 NOTE — Assessment & Plan Note (Addendum)
poct urine dip in office ?Pregnancy urine negative ?Urine culture ordered pending results ? ?

## 2021-07-27 NOTE — Assessment & Plan Note (Signed)
Prescription given for augmentin 875/125 mg po bid for ten days. Pt to continue tylenol/ibuprofen prn sinus pain. Continue with humidifier prn and steam showers recommended as well. instructed If no symptom improvement in 48 hours please f/u ? ?

## 2021-07-27 NOTE — Assessment & Plan Note (Addendum)
covid test today in office, negative ?

## 2021-07-28 LAB — URINE CULTURE
MICRO NUMBER:: 13389193
SPECIMEN QUALITY:: ADEQUATE

## 2021-07-29 NOTE — Progress Notes (Signed)
Urine negative for infection.

## 2021-08-04 ENCOUNTER — Emergency Department (HOSPITAL_COMMUNITY): Payer: PRIVATE HEALTH INSURANCE

## 2021-08-04 ENCOUNTER — Encounter (HOSPITAL_COMMUNITY): Payer: Self-pay | Admitting: Emergency Medicine

## 2021-08-04 ENCOUNTER — Emergency Department (HOSPITAL_COMMUNITY)
Admission: EM | Admit: 2021-08-04 | Discharge: 2021-08-04 | Disposition: A | Discharge status: Home / Self Care | Payer: PRIVATE HEALTH INSURANCE | Attending: Emergency Medicine | Admitting: Emergency Medicine

## 2021-08-04 ENCOUNTER — Other Ambulatory Visit: Payer: Self-pay

## 2021-08-04 DIAGNOSIS — R110 Nausea: Secondary | ICD-10-CM | POA: Insufficient documentation

## 2021-08-04 DIAGNOSIS — R42 Dizziness and giddiness: Secondary | ICD-10-CM

## 2021-08-04 DIAGNOSIS — R471 Dysarthria and anarthria: Secondary | ICD-10-CM | POA: Diagnosis not present

## 2021-08-04 DIAGNOSIS — H5500 Unspecified nystagmus: Secondary | ICD-10-CM | POA: Insufficient documentation

## 2021-08-04 DIAGNOSIS — F17200 Nicotine dependence, unspecified, uncomplicated: Secondary | ICD-10-CM | POA: Insufficient documentation

## 2021-08-04 DIAGNOSIS — R2689 Other abnormalities of gait and mobility: Secondary | ICD-10-CM | POA: Diagnosis not present

## 2021-08-04 LAB — CBC
HCT: 39.7 % (ref 36.0–46.0)
Hemoglobin: 13.3 g/dL (ref 12.0–15.0)
MCH: 31.3 pg (ref 26.0–34.0)
MCHC: 33.5 g/dL (ref 30.0–36.0)
MCV: 93.4 fL (ref 80.0–100.0)
Platelets: 161 10*3/uL (ref 150–400)
RBC: 4.25 MIL/uL (ref 3.87–5.11)
RDW: 14.1 % (ref 11.5–15.5)
WBC: 7.6 10*3/uL (ref 4.0–10.5)
nRBC: 0 % (ref 0.0–0.2)

## 2021-08-04 LAB — BASIC METABOLIC PANEL
Anion gap: 5 (ref 5–15)
BUN: 9 mg/dL (ref 6–20)
CO2: 23 mmol/L (ref 22–32)
Calcium: 9.4 mg/dL (ref 8.9–10.3)
Chloride: 112 mmol/L — ABNORMAL HIGH (ref 98–111)
Creatinine, Ser: 0.86 mg/dL (ref 0.44–1.00)
GFR, Estimated: >60 mL/min (ref >60)
Glucose, Bld: 91 mg/dL (ref 70–99)
Potassium: 3.6 mmol/L (ref 3.5–5.1)
Sodium: 140 mmol/L (ref 135–145)

## 2021-08-04 LAB — URINALYSIS, ROUTINE W REFLEX MICROSCOPIC
Bilirubin Urine: NEGATIVE
Glucose, UA: NEGATIVE mg/dL
Hgb urine dipstick: NEGATIVE
Ketones, ur: NEGATIVE mg/dL
Nitrite: NEGATIVE
Protein, ur: NEGATIVE mg/dL
Specific Gravity, Urine: 1.026 (ref 1.005–1.030)
pH: 5 (ref 5.0–8.0)

## 2021-08-04 LAB — I-STAT BETA HCG BLOOD, ED (MC, WL, AP ONLY): I-stat hCG, quantitative: <5 m[IU]/mL (ref <5)

## 2021-08-04 MED ORDER — MECLIZINE HCL 25 MG PO TABS: 25.0000 mg | ORAL_TABLET | Freq: Three times a day (TID) | ORAL | 0 refills | Status: AC | PRN

## 2021-08-04 MED ORDER — MECLIZINE HCL 25 MG PO TABS
25.0000 mg | ORAL_TABLET | Freq: Once | ORAL | Status: AC
  Administered 2021-08-04: 25 mg via ORAL
  Filled 2021-08-04: qty 1

## 2021-08-04 MED ORDER — SODIUM CHLORIDE 0.9% FLUSH: 3.0000 mL | Freq: Once | INTRAVENOUS | Status: DC

## 2021-08-04 MED ORDER — DIAZEPAM 2 MG PO TABS
2.0000 mg | ORAL_TABLET | Freq: Once | ORAL | Status: AC
  Administered 2021-08-04: 2 mg via ORAL
  Filled 2021-08-04: qty 1

## 2021-08-04 NOTE — Discharge Instructions (Addendum)
You can take meclizine, 1 tablet up to 3 times a day as needed for vertigo symptoms. Please follow-up with your primary care doctor within the next week.

## 2021-08-04 NOTE — ED Triage Notes (Signed)
Pt reports dizziness and nausea that started around 9am at work. States they told her to go home because she almost fell.  No arm drift.  Denies numbness/weakness.  States she saw her PCP a few weeks ago for dizziness and started on medication for vertigo that she last took yesterday.

## 2021-08-04 NOTE — ED Notes (Signed)
Patient transported to MRI 
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
## 2021-08-04 NOTE — ED Provider Notes (Signed)
[facility]   CSN: [id number] Arrival date & time: 08/04/21  1102     History  Chief Complaint  Patient presents with   Dizziness    Lacey Miles is a 46 y.o. female who presents emergency department for vertigo and imbalance.  Patient has a history of the same.  She was seen by her primary care physician recently, told that she had some fluid behind her right ear, started on antibiotics and meclizine.  She has had mild symptoms since that time however today while she was at work she turned her head and then suddenly became extremely dizzy with room spinning dizziness, retching and nausea and severe imbalance.  She states that every time she tries to stand and walk or turns her head to the right her symptoms get worse.  She is able to walk however she is having balance issues and feels safer when holding onto something.  Patient feels better at rest.  She denies any tinnitus, chest pain, shortness of breath.  She is a smoker.  She has no history of hypertension, diabetes, hyperlipidemia.   Dizziness Quality:  Head spinning and imbalance     Home Medications Prior to Admission medications   Medication Sig Start Date End Date Taking? Authorizing Provider  amoxicillin-clavulanate (AUGMENTIN) 875-125 MG tablet Take 1 tablet by mouth 2 (two) times daily. 07/27/21   Mort Sawyersugal, Tabitha, FNP  divalproex (DEPAKOTE ER) 500 MG 24 hr tablet Take 500 mg by mouth daily. 01/11/21   [provider]  levETIRAcetam (KEPPRA) 750 MG tablet Take 750 mg by mouth 2 (two) times daily. 05/17/21   [provider]  meclizine (ANTIVERT) 25 MG tablet Take 1/2 to one tablet po qhs prn vertigo 07/27/21   Mort Sawyersugal, Tabitha, FNP  omeprazole (PRILOSEC) 20 MG capsule Take 1 capsule (20 mg total) by mouth daily. 07/27/21   Mort Sawyersugal, Tabitha, FNP  SUMAtriptan (IMITREX) 25 MG tablet Take 1 tablet (25 mg total) by mouth once for 1 dose. May repeat in 2 hours  if needed 07/20/21 07/20/21  Eden Emmsable, James M, NP  topiramate (TOPAMAX) 50 MG tablet Take 50 mg by mouth daily. 11/09/20   [provider]  traZODone (DESYREL) 100 MG tablet Take 1 tablet by mouth at bedtime.    [provider]      Allergies    Minocycline, Tomato, Trazodone and nefazodone, and Tramadol    Review of Systems   Review of Systems  Neurological:  Positive for dizziness.   Physical Exam Updated Vital Signs BP 121/75 (BP Location: Right Arm)   Pulse 92   Temp 97.8 F (36.6 C)   Resp 18   LMP 07/17/2021   SpO2 99%  Physical Exam Vitals and nursing note reviewed.  Constitutional:      General: She is not in acute distress.    Appearance: She is well-developed. She is not diaphoretic.  HENT:     Head: Normocephalic and atraumatic.     Right Ear: External ear normal.     Left Ear: External ear normal.     Nose: Nose normal.     Mouth/Throat:     Mouth: Mucous membranes are moist.  Eyes:     General: No visual field deficit or scleral icterus.    Extraocular Movements: Extraocular movements intact.     Right eye: Nystagmus present.     Left eye: Nystagmus present.     Conjunctiva/sclera: Conjunctivae normal.     Comments:  Left sided nystagmus greater than 3 beats   Cardiovascular:     Rate and Rhythm: Normal rate and regular rhythm.     Heart sounds: Normal heart sounds. No murmur heard.   No friction rub. No gallop.  Pulmonary:     Effort: Pulmonary effort is normal. No respiratory distress.     Breath sounds: Normal breath sounds.  Abdominal:     General: Bowel sounds are normal. There is no distension.     Palpations: Abdomen is soft. There is no mass.     Tenderness: There is no abdominal tenderness. There is no guarding.  Musculoskeletal:     Cervical back: Normal range of motion.  Skin:    General: Skin is warm and dry.  Neurological:     Mental Status: She is alert and oriented to person, place, and time.     Cranial Nerves:  Dysarthria present. No cranial nerve deficit or facial asymmetry.     Gait: Gait abnormal.     Comments: Patient's balance is off.  She is ataxic with walking  Psychiatric:        Behavior: Behavior normal.    ED Results / Procedures / Treatments   Labs (all labs ordered are listed, but only abnormal results are displayed) Labs Reviewed  BASIC METABOLIC PANEL - Abnormal; Notable for the following components:      Result Value   Chloride 112 (*)    All other components within normal limits  URINALYSIS, ROUTINE W REFLEX MICROSCOPIC - Abnormal; Notable for the following components:   APPearance HAZY (*)    Leukocytes,Ua MODERATE (*)    Bacteria, UA RARE (*)    All other components within normal limits  CBC  I-STAT BETA HCG BLOOD, ED ([facility], [facility], [facility] ONLY)    EKG EKG Interpretation  Date/Time:  Saturday Aug 04 2021 11:08:26 EDT Ventricular Rate:  91 PR Interval:  130 QRS Duration: 82 QT Interval:  326 QTC Calculation: 400 R Axis:   73 Text Interpretation: Normal sinus rhythm Cannot rule out Anterior infarct , age undetermined Abnormal ECG No previous ECGs available no prior ECG for comparison. No STEMI Confirmed by Theda Belfast ([id number]) on 08/04/2021 11:20:32 AM  Radiology No results found.  Procedures Procedures    Medications Ordered in ED Medications  sodium chloride flush (NS) 0.9 % injection 3 mL (3 mLs Intravenous Not Given 08/04/21 1122)    ED Course/ Medical Decision Making/ A&P Clinical Course as of 08/04/21 1909  Sat Aug 04, 2021  1315 I-Stat beta hCG blood, ED [AH]  1315 CBC [AH]  1315 Urinalysis, Routine w reflex microscopic Urine, Clean Catch(!) [AH]  1315 Basic metabolic panel(!) [AH]    Clinical Course User Index [AH] Arthor Captain, PA-C                           Medical Decision Making Patient here with vertigo. The emergent differential diagnosis for acute vertigo low includes peripheral causes such as BPPV, barotrauma, ear foreign body,  Mnire's disease, infectious causes such as lip bronchitis, vestibular neuritis or Ramsay Hunt syndrome.  Other emergent causes are central such as cerebellar stroke, vertebrobasilar insufficiency, neoplastic causes, vertebral artery dissection, MS, neurosyphilis or tuberculosis, epilepsy or migraine.  Other causes include anemia, hyperviscosity syndrome, alcohol or aminoglycoside use, renal failure, hypoglycemia and thyroid disease.  Patient CT shows abnormal cerebellar findings.  MRI pending.  Labs are reassuring.  S/o given to Dr. Malen Gauze at shift  change.  Amount and/or Complexity of Data Reviewed Labs: ordered. Decision-making details documented in ED Course. Radiology: ordered.  Risk Prescription drug management.           Final Clinical Impression(s) / ED Diagnoses Final diagnoses:  None    Rx / DC Orders ED Discharge Orders     None         Arthor Captain, PA-C 08/04/21 1913    Tegeler, Canary Brim, MD 08/05/21 1534
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
## 2021-08-20 ENCOUNTER — Other Ambulatory Visit: Payer: Self-pay | Admitting: Nurse Practitioner

## 2021-08-20 DIAGNOSIS — G43109 Migraine with aura, not intractable, without status migrainosus: Secondary | ICD-10-CM
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
## 2021-08-20 NOTE — Telephone Encounter (Signed)
Called patient but voicemail not set up, called mother's phone and left a message asking to have patient call us back

## 2021-08-24 ENCOUNTER — Encounter: Payer: Self-pay | Admitting: Family

## 2021-08-24 ENCOUNTER — Ambulatory Visit (INDEPENDENT_AMBULATORY_CARE_PROVIDER_SITE_OTHER): Payer: 59 | Admitting: Family

## 2021-08-24 ENCOUNTER — Ambulatory Visit (INDEPENDENT_AMBULATORY_CARE_PROVIDER_SITE_OTHER)
Admission: RE | Admit: 2021-08-24 | Discharge: 2021-08-24 | Disposition: A | Discharge status: Home / Self Care | Payer: 59 | Source: Ambulatory Visit | Attending: Family | Admitting: Family

## 2021-08-24 VITALS — BP 115/76 | HR 83 | Temp 98.2°F | Resp 16 | Ht 70.0 in | Wt 139.0 lb

## 2021-08-24 DIAGNOSIS — S99912A Unspecified injury of left ankle, initial encounter: Secondary | ICD-10-CM | POA: Diagnosis not present

## 2021-08-24 NOTE — Progress Notes (Signed)
Established Patient Office Visit  Subjective:  Patient ID: Lacey Miles, female    DOB: 06/18/1975  Age: 46 y.o. MRN: [id number]  CC:  Chief Complaint  Patient presents with   Foot Injury    Left boot    HPI [name] is here today with concerns.   5/25 was injured at work. She had a slip and fall. She was on her lunch break, had clocked out and went to lunch room. When she came out the floor was wet, no signs at the area, and she slipped ad fell and she hit her left outer thigh, some of her left knee, and twisted her left ankle.   She saw work Designer, multimedia, and she states her symptoms are not improving and very hard to bear weight on her ankle. She was placed in a immobility boot for support by urgent care/fast med.   For pain she is taking mobic 7.5mg  helping with swelling, but still in pain.  Reviewed left ankle xray from 5/25 and negative for acute fracture, with soft tissue swelling.   Past Medical History:  Diagnosis Date   Anxiety    Depression    Seizures (HCC)     Past Surgical History:  Procedure Laterality Date   ESOPHAGUS SURGERY     as a baby, 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 legs were bended inwards   TUBAL LIGATION      Family History  Problem Relation Age of Onset   Heart disease Mother    Stroke Mother    Diabetes Mother    Asthma Father    Heart disease Father        open heart surgery   Stroke Father    Diabetes Father    Asthma Brother    Brain cancer Maternal Grandmother    Emphysema Maternal Grandfather    Cancer Paternal Grandmother        unsure of the type    Social History   Socioeconomic History   Marital status: Divorced    Spouse name: Not on file   Number of children: 3   Years of education: Not on file   Highest education level: Not on file  Occupational  History   Not on file  Tobacco Use   Smoking status: Every Day    Packs/day: 0.50    Years: 30.00    Total pack years: 15.00    Types: Cigarettes   Smokeless tobacco: Never  Vaping Use   Vaping Use: Former  Substance and Sexual Activity   Alcohol use: Not Currently   Drug use: Not Currently    Types: "Crack" cocaine    Comment: quit in 2022   Sexual activity: Not on file  Other Topics Concern   Not on file  Social History Narrative   Fulltime: Conservation officer, nature for [employer]   Social Determinants of Health   Financial Resource Strain: Not on file  Food Insecurity: Not on file  Transportation Needs: Not on file  Physical Activity: Not on file  Stress: Not on file  Social Connections: Not on file  Intimate Partner Violence: Not on file    Outpatient Medications Prior to Visit  Medication Sig Dispense Refill   divalproex (DEPAKOTE ER) 500 MG 24 hr tablet Take 500 mg by mouth daily.  levETIRAcetam (KEPPRA) 750 MG tablet Take 750 mg by mouth 2 (two) times daily.     meclizine (ANTIVERT) 25 MG tablet Take 1/2 to one tablet po qhs prn vertigo 20 tablet 0   meloxicam (MOBIC) 7.5 MG tablet Take by mouth.     omeprazole (PRILOSEC) 20 MG capsule Take 1 capsule (20 mg total) by mouth daily. 30 capsule 3   topiramate (TOPAMAX) 50 MG tablet Take 50 mg by mouth daily.     traZODone (DESYREL) 100 MG tablet Take 1 tablet by mouth at bedtime.     SUMAtriptan (IMITREX) 25 MG tablet Take 1 tablet (25 mg total) by mouth once for 1 dose. May repeat in 2 hours if needed 10 tablet 0   amoxicillin-clavulanate (AUGMENTIN) 875-125 MG tablet Take 1 tablet by mouth 2 (two) times daily. (Patient not taking: Reported on 08/24/2021) 20 tablet 0   No facility-administered medications prior to visit.    Allergies  Allergen Reactions   Minocycline Anaphylaxis   Tomato Rash   Trazodone And Nefazodone Anaphylaxis   Tramadol         Objective:    Physical Exam Musculoskeletal:     Left foot:  Decreased range of motion (ankle pain upon movement and foot with any rotation).       Feet:     BP 115/76   Pulse 83   Temp 98.2 F (36.8 C)   Resp 16   Ht 5\' 10"  (1.778 m)   Wt 139 lb (63 kg)   LMP 08/02/2021   SpO2 99%   BMI 19.94 kg/m  Wt Readings from Last 3 Encounters:  08/24/21 139 lb (63 kg)  07/27/21 137 lb 7 oz (62.3 kg)  07/20/21 137 lb 6 oz (62.3 kg)     Health Maintenance Due  Topic Date Due   HIV Screening  Never done   Hepatitis C Screening  Never done   TETANUS/TDAP  Never done   PAP SMEAR-Modifier  Never done   COVID-19 Vaccine (3 - Pfizer risk series) 12/23/2019    There are no preventive care reminders to display for this patient.  Lab Results  Component Value Date   TSH 0.86 07/20/2021   Lab Results  Component Value Date   WBC 7.6 08/04/2021   HGB 13.3 08/04/2021   HCT 39.7 08/04/2021   MCV 93.4 08/04/2021   PLT 161 08/04/2021   Lab Results  Component Value Date   NA 140 08/04/2021   K 3.6 08/04/2021   CO2 23 08/04/2021   GLUCOSE 91 08/04/2021   BUN 9 08/04/2021   CREATININE 0.86 08/04/2021   BILITOT 0.3 07/20/2021   AST 9 (L) 07/20/2021   ALT 9 07/20/2021   PROT 6.6 07/20/2021   CALCIUM 9.4 08/04/2021   ANIONGAP 5 08/04/2021   No results found for: "HGBA1C"    Assessment & Plan:   Problem List Items Addressed This Visit       Other   Injury of left ankle - Primary    Continue meloxicam Left xray ankle today to r/o fracture since not as acute anymore, able to visualize Stat referral to orthopedist.  Continue wearing supportive boot Do suggest taking weight off of ankle, using crutches if able.  Icing daily      Relevant Orders   Ambulatory referral to Orthopedic Surgery   DG Ankle Complete Left    No orders of the defined types were placed in this encounter.   Follow-up: No follow-ups on file.  Eugenia Pancoast, FNP

## 2021-08-24 NOTE — Assessment & Plan Note (Signed)
Continue meloxicam Left xray ankle today to r/o fracture since not as acute anymore, able to visualize Stat referral to orthopedist.  Continue wearing supportive boot Do suggest taking weight off of ankle, using crutches if able.  Icing daily

## 2021-08-24 NOTE — Patient Instructions (Signed)
A referral was placed today to orthopedist Please let us know if you have not heard back within 2 weeks about the referral.  Complete xray(s) prior to leaving today. I will notify you of your results once received.  Due to recent changes in healthcare laws, you may see results of your imaging and/or laboratory studies on MyChart before I have had a chance to review them.  I understand that in some cases there may be results that are confusing or concerning to you. Please understand that not all results are received at the same time and often I may need to interpret multiple results in order to provide you with the best plan of care or course of treatment. Therefore, I ask that you please give me 2 business days to thoroughly review all your results before contacting my office for clarification. Should we see a critical lab result, you will be contacted sooner.   It was a pleasure seeing you today! Please do not hesitate to reach out with any questions and or concerns.  Regards,   Eugenia Pancoast FNP-C

## 2021-08-27 NOTE — Progress Notes (Signed)
Xray was without acute findings on ankle

## 2021-08-29 ENCOUNTER — Encounter: Payer: Self-pay | Admitting: Surgical

## 2021-08-29 ENCOUNTER — Ambulatory Visit: Payer: 59 | Admitting: Surgical

## 2021-08-29 DIAGNOSIS — M25572 Pain in left ankle and joints of left foot: Secondary | ICD-10-CM | POA: Diagnosis not present

## 2021-08-29 NOTE — Progress Notes (Signed)
Office Visit Note   Patient: Lacey Miles           Date of Birth: [birth year]           MRN: [id number] Visit Date: 08/29/2021 Requested by: Mort Sawyers, FNP [address] [place],  [place] [address] PCP: Eden Emms, NP  Subjective: Chief Complaint  Patient presents with   Left Ankle - Pain    HPI: Lacey Miles is a 46 y.o. female with past medical history of migraine, vertigo, bipolar 1 disorder, polysubstance use, seizures, tobacco use who presents to the office complaining of left foot/ankle pain.  Patient injured her left ankle on 08/09/2021 when she was walking at her workplace.  She was walking the area where the janitor was mopping and did not see any wet floor sign and states that she fell after slipping on the wet floor.  She fell hitting the back of her head and the left side of her body onto the ground.  She has had constant pain since the fall.  No history of prior injury to the left ankle.  She has had history of surgery to both feet when she was about 46 years old but she cannot recall exactly what procedure this was.  She cannot bear weight on the left lower extremity without the use of the boot.  She states that she was seen by [employer] provider who recommended sedentary duty with the use of the boot but she states that she cannot really do any sedentary duty as she works at [employer] and the vast majority of the job is standing.  She works as a Conservation officer, nature at [employer].  She is taking anti-inflammatories without relief.  Most of her pain she localizes to the lateral aspect of the ankle.  She states that "the pain hurts so bad that it will shoot up from my ankle to my neck"..                ROS: All systems reviewed are negative as they relate to the chief complaint within the history of present illness.  Patient denies fevers or chills.  Assessment & Plan: Visit Diagnoses:  1. Pain in left ankle and joints of left foot     Plan: Patient is a  46 year old female who presents for evaluation of left foot/ankle pain.  She states that she slipped in water where Genter was mopping on 08/09/2021 at her workplace of [employer].  She was initially not satisfied with the [employer] provider that saw her and has obtained a lawyer who recommended she go to her primary care provider and obtain a referral to orthopedics.  That is how she ended up here today.  She is currently complaining of continued pain that has not improved since the date of injury about 3 weeks ago.  Most of her pain is in the lateral aspect of the ankle and centered around the lateral malleolus.  She has radiographs that were reviewed and are negative for any fracture or dislocation or any significant pathology that is identified.  She has generalized tenderness that seems out of proportion to the amount of pressure applied with palpation and in some cases do not really correlate with any specific anatomic structure.  Not really much swelling or any bruising compared with her contralateral ankle.  Plan is to try physical therapy upstairs 1 time per week for 4 weeks with a designing of home exercise program.  Follow-up in 4  weeks for clinical recheck with consideration of MRI scan at that time if patient is still having difficulty bearing weight with little to no improvement in pain.  Patient agreed with this plan.  She also has similar fall previously noted on emergency department note on 04/28/2021.  Follow-Up Instructions: No follow-ups on file.   Orders:  Orders Placed This Encounter  Procedures   Ambulatory referral to Physical Therapy   No orders of the defined types were placed in this encounter.     Procedures: No procedures performed   Clinical Data: No additional findings.  Objective: Vital Signs: LMP 08/02/2021   Physical Exam:  Constitutional: Patient appears well-developed HEENT:  Head: Normocephalic Eyes:EOM are normal Neck: Normal range of  motion Cardiovascular: Normal rate Pulmonary/chest: Effort normal Neurologic: Patient is alert Skin: Skin is warm Psychiatric: Patient has normal mood and affect  Ortho Exam: Ortho exam demonstrates left foot and ankle with 2+ DP pulse of the extremity.  No significant calf tenderness.  Negative Homans' sign.  She does have intact dorsiflexion, plantarflexion, inversion, eversion with severe pain with all 4 motions.  She has tenderness that seems out of proportion to actual palpation throughout pretty much the entirety of the left foot and ankle.  There is no significant ecchymosis or swelling noted in the left ankle compared to contralateral ankle.  Syndesmosis feels stable by stressing on exam today.  Peroneal tendons are palpable just inferior to the distal fibula and there is no significant fluid around the tendons noted compared with contralateral side.  Achilles tendon is palpable and intact by palpation.  Anterior tibialis tendon is intact by palpation.  Specialty Comments:  No specialty comments available.  Imaging: No results found.   PMFS History: Patient Active Problem List   Diagnosis Date Noted   Injury of left ankle 08/24/2021   Benign paroxysmal positional vertigo of right ear 07/27/2021   Anxiety 07/20/2021   Migraine with aura and without status migrainosus, not intractable 07/20/2021   Bipolar 1 disorder (HCC) 01/11/2021   Polysubstance (excluding opioids) dependence, daily use (HCC) 01/11/2021   Seizures (HCC) 01/11/2021   Tobacco use 01/11/2021   Past Medical History:  Diagnosis Date   Anxiety    Depression    Seizures (HCC)     Family History  Problem Relation Age of Onset   Heart disease Mother    Stroke Mother    Diabetes Mother    Asthma Father    Heart disease Father        open heart surgery   Stroke Father    Diabetes Father    Asthma Brother    Brain cancer Maternal Grandmother    Emphysema Maternal Grandfather    Cancer Paternal Grandmother         unsure of the type    Past Surgical History:  Procedure Laterality Date   ESOPHAGUS SURGERY     as a baby, 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 legs were bended inwards   TUBAL LIGATION     Social History   Occupational History   Not on file  Tobacco Use   Smoking status: Every Day    Packs/day: 0.50    Years: 30.00    Total pack years: 15.00    Types: Cigarettes   Smokeless tobacco: Never  Vaping  Use   Vaping Use: Former  Substance and Sexual Activity   Alcohol use: Not Currently   Drug use: Not Currently    Types: "Crack" cocaine    Comment: quit in 2022   Sexual activity: Not on file

## 2021-09-04 ENCOUNTER — Encounter: Payer: Self-pay | Admitting: Rehabilitative and Restorative Service Providers"

## 2021-09-04 ENCOUNTER — Other Ambulatory Visit: Payer: Self-pay

## 2021-09-04 ENCOUNTER — Ambulatory Visit (INDEPENDENT_AMBULATORY_CARE_PROVIDER_SITE_OTHER): Payer: 59 | Admitting: Rehabilitative and Restorative Service Providers"

## 2021-09-04 DIAGNOSIS — M25572 Pain in left ankle and joints of left foot: Secondary | ICD-10-CM

## 2021-09-04 DIAGNOSIS — M6281 Muscle weakness (generalized): Secondary | ICD-10-CM

## 2021-09-04 DIAGNOSIS — R2689 Other abnormalities of gait and mobility: Secondary | ICD-10-CM

## 2021-09-04 NOTE — Therapy (Addendum)
[facility] Physical Therapy [address] [place], [place], [address] Phone: [phone]   Fax:  [phone]  Patient Details  Name: Lacey Miles MRN: [id number] Date of Birth: 25-Nov-1975 Referring Provider:  Donella Stade, PA-C  Encounter Date: 09/04/2021   OUTPATIENT PHYSICAL THERAPY LOWER EXTREMITY EVALUATION DISCHARGE SUMMARY   Patient Name: Lacey Miles MRN: [id number] DOB:[birth year], 46 y.o., female Today's Date: 09/04/2021   PT End of Session - 09/04/21 0803     Visit Number 1    Number of Visits 12    Date for PT Re-Evaluation 10/16/21    Authorization Type [employer]    Progress Note Due on Visit 10    PT Start Time 0804    PT Stop Time 0858    PT Time Calculation (min) 54 min    Activity Tolerance Patient tolerated treatment well;No increased pain    Behavior During Therapy WFL for tasks assessed/performed             Past Medical History:  Diagnosis Date   Anxiety    Depression    Seizures ([place])    Past Surgical History:  Procedure Laterality Date   ESOPHAGUS SURGERY     as a baby, 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 legs were bended inwards   TUBAL LIGATION     Patient Active Problem List   Diagnosis Date Noted   Injury of left ankle 08/24/2021   Benign paroxysmal positional vertigo of right ear 07/27/2021   Anxiety 07/20/2021   Migraine with aura and without status migrainosus, not intractable 07/20/2021   Bipolar 1 disorder ([place]) 01/11/2021   Polysubstance (excluding opioids) dependence, daily use ([place]) 01/11/2021   Seizures ([place]) 01/11/2021   Tobacco use 01/11/2021    PCP: Karl Ito, NP  REFERRING PROVIDER: Donella Stade, PA-C  REFERRING DIAG: [phone] (ICD-10-CM) - Pain in left ankle and joints of left foot  THERAPY DIAG:  Pain in left ankle and  joints of left foot  Other abnormalities of gait and mobility  Muscle weakness (generalized)  Rationale for Evaluation and Treatment Rehabilitation  ONSET DATE: 08/09/21  SUBJECTIVE:   SUBJECTIVE STATEMENT: "Without the boot, I am in pain, pain. My foot is turning. Without the boot, my foot turns in and to the right. Mainly hurts on the outside of my foot. "  PERTINENT HISTORY: Patient injured her left ankle on 08/09/2021 when she was walking at her workplace.  She was walking the area where the janitor was mopping and did not see any wet floor sign and states that she fell after slipping on the wet floor.  She fell hitting the back of her head and the left side of her body onto the ground.  She has had constant pain since the fall. 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 Pt has not returned to work since 08/09/21. Pt had prior foot surgeries when she was 46 year old.   PAIN:  9.5/10 without the boot Pain is on the outside of the left foot Icing relieves Not wearing the boot aggravates Description:  without the boot-stabbing pain and pain radiates from the inside of the foot all the way up the left side of the body to the neck Pt reports not putting on the boot time she wakes up and when the pain gets bad she puts it on.   7/10 at evaluation. Pt wore boot to evaluation  PRECAUTIONS: None  WEIGHT BEARING RESTRICTIONS No  FALLS:  Has patient fallen in last 6 months? Yes. Number of falls 1-5/25/23  LIVING ENVIRONMENT: Lives with: lives with their family Lives in: House/apartment Stairs: Inside and outside; has to use the rails Has following equipment at home:  none  OCCUPATION: works as Animal nutritionist at [employer] : "to get out of  this boot and work."   OBJECTIVE:   DIAGNOSTIC FINDINGS: 08/24/21: Normal left ankle radiographs.  PATIENT SURVEYS:  FOTO 41% function  COGNITION:  Overall cognitive status: Within functional limits for tasks assessed     SENSATION: Light touch: Impaired : pt is hypersensitive to all touch along lower leg but with emphasis on lateral malleoli L  EDEMA: none  POSTURE:  pt stands with L foot internally rotated and supinated with left great toe off of the ground and toes 2-5 pressed into the floor and with L knee flexed.   Pt also rests supine in this position but not as much as weightbearing. Pt is unable to stand without leaning onto countertop.   PALPATION: Patella mobility = bil ; ITB L tight; hypersensitivity to very light touch along lower leg; navicular bone drop L; plantar fascia appears tighter left but pt unable to fully perform assessment. Pt was able to tolerate PT PROM of ankle to neutral position but when in neutral position, pt reported discomfort L lateral malleoli. Anterior Tib overactive on observation  LOWER EXTREMITY ROM:  Active ROM Right eval Left eval  Hip flexion    Hip extension    Hip abduction    Hip adduction    Hip internal rotation    Hip external rotation    Knee flexion 130 64  Knee extension 0 -4  Ankle dorsiflexion To neutral -40  Ankle plantarflexion 52 -42  Ankle inversion 25 4 degrees from -40  Ankle eversion 18 1 degree from -40   (Blank rows = not tested) Resting position L -40  LOWER EXTREMITY MMT:  MMT Right eval Left eval  Hip flexion 4-/5 4+/5  Hip extension    Hip abduction    Hip adduction    Hip internal rotation    Hip external rotation    Knee flexion 4+/5 Unable to be tested due to lower leg pain  Knee extension 4/5 4/5  Ankle dorsiflexion 4/5 1/5  Ankle plantarflexion 3+/5 1/5  Ankle inversion 4/5 1/5  Ankle eversion 4/5 1/5   (Blank rows = not tested)  LOWER EXTREMITY SPECIAL TESTS: Obers/Thomas  +   FUNCTIONAL TESTS: unable to SLS with functional WB. Does not perform WB with STS unless in boot   GAIT: Distance walked: in gym with boot, unable to be tested without boot due to leg pain Assistive device utilized: None     TODAY'S TREATMENT: At eval: Discussed PT POC; discussed foot anatomy and importance of getting out of position; began discussing desensitization due to hypersensitivity to light touch   PATIENT EDUCATION:  Education details: see today's treatment Person educated: Patient Education method: Explanation Education comprehension: verbalized understanding   HOME EXERCISE PROGRAM: Not issued at eval; want pt to  see MD first  ASSESSMENT:  CLINICAL IMPRESSION: Patient is a 45 y.o. female who was seen today for physical therapy evaluation and treatment for L ankle pain after a fall at work 08/09/21. Pt demonstrates abnormal foot positioning at rest with foot in inversion and supination. Pt is still wearing a cam boot and when not in cam boot, she experiences excruciating pain. Pt demonstrates decreased ROM, strength, abnormal foot positioning, and abnormal gait. She is also hypersensitive to touch throughout her lower leg with emphasis on superior to lateral malleoli. PT recommended she see MD prior to beginning PT treatment due to navicular drop and hypersensitivity; possible MRI to rule out other impairments due to pain level and pt presentation.  Pt is very pain dominant.   OBJECTIVE IMPAIRMENTS Abnormal gait, decreased activity tolerance, decreased balance, decreased coordination, decreased endurance, decreased mobility, difficulty walking, decreased ROM, decreased strength, hypomobility, impaired flexibility, postural dysfunction, and pain.   ACTIVITY LIMITATIONS carrying, sitting, standing, squatting, stairs, transfers, and caring for others  PARTICIPATION LIMITATIONS: meal prep, cleaning, community activity, and occupation  PERSONAL FACTORS 1 comorbidity: L  ankle pain  are also affecting patient's functional outcome.   REHAB POTENTIAL: Good  CLINICAL DECISION MAKING: Evolving/moderate complexity  EVALUATION COMPLEXITY: Moderate   GOALS: Goals reviewed with patient? Yes  SHORT TERM GOALS: Target date: 09/25/2021  Pt will be indep with initial HEP to assist with L foot pain management Baseline: not issued at eval Goal status: INITIAL  2.  Pt will be able to SLS without boot x 5 sec at counter without compensation Baseline: unable Goal status: INITIAL  3.  Pt will demo improved L foot resting position supine to </=-20 degrees to assist with more normalized gait Baseline: -40 degrees Goal status: INITIAL    LONG TERM GOALS: Target date:  10/16/21    Pt will be indep with HEP for further ankle strengthening Baseline: not issued at eval Goal status: INITIAL  2.  Pt will be able to walk > 4 hours without the boot Baseline: 1 hour holding onto the wall and furniture at home and not continous Goal status: INITIAL  3.  Pt will be able to stand to cook >30 min Baseline: 10-15 min prior to having to sit due to pain Goal status: INITIAL  4.  Pt will demo improved DF to -25 degrees to assist with functional mobility Baseline: -40 degrees Goal status: INITIAL  5.  Pt will demo improved L ankle strength >/=3/5 to assist with community gait Baseline: 1/5 throughout L ankle Goal status: INITIAL  6.  Pt will demo L SLS without UE A >/= 25 sec to assist with gait in the community Baseline: unable due to pain Goal status: INITIAL   PLAN: PT FREQUENCY: 2x/week  PT DURATION: 6 weeks  PLANNED INTERVENTIONS: Therapeutic exercises, Therapeutic activity, Neuromuscular re-education, Balance training, Gait training, Patient/Family education, Joint mobilization, Stair training, DME instructions, Dry Needling, Cryotherapy, Moist heat, Compression bandaging, Taping, Vasopneumatic device, Ultrasound, Ionotophoresis 92m/ml Dexamethasone, Manual  therapy, and Re-evaluation  PLAN FOR NEXT SESSION: Issue HEP; what did MD say   NAmerica Brown PT 09/04/2021, 9:01 AM    NAmerica Brown PT 09/04/2021, 9:01 AM  [facility] [address] [address]Phone: [phone]  Fax:  [phone]    PHYSICAL THERAPY DISCHARGE SUMMARY  Visits from Start of Care: 1  Current functional level related to goals / functional outcomes: See above   Remaining deficits: See above   Education /  Equipment: N/a   Patient agrees to discharge. Patient goals were not met. Patient is being discharged due to not returning since the last visit.  Laureen Abrahams, PT, DPT 11/23/21 8:21 AM  [facility] Therapy [address] [place], [place], [address] Phone: [phone]   Fax:  [phone]

## 2021-09-05 ENCOUNTER — Ambulatory Visit (INDEPENDENT_AMBULATORY_CARE_PROVIDER_SITE_OTHER): Payer: 59 | Admitting: Surgical

## 2021-09-05 ENCOUNTER — Encounter: Payer: Self-pay | Admitting: Orthopedic Surgery

## 2021-09-05 ENCOUNTER — Encounter: Payer: 59 | Admitting: Rehabilitative and Restorative Service Providers"

## 2021-09-05 DIAGNOSIS — M25572 Pain in left ankle and joints of left foot: Secondary | ICD-10-CM | POA: Diagnosis not present

## 2021-09-05 NOTE — Progress Notes (Signed)
Office Visit Note   Patient: Lacey Miles           Date of Birth: [birth year]           MRN: [id number] Visit Date: 09/05/2021 Requested by: Eden Emms, NP [address] [place],  [place] [address] PCP: Eden Emms, NP  Subjective: Chief Complaint  Patient presents with   Left Ankle - Follow-up    HPI: Lacey Miles is a 46 y.o. female who presents to the office complaining of continued severe left ankle pain.  Patient returns stating that she was just barely able to finish her physical therapy session yesterday.  She had severe pain that has been increased following her therapy session.  She cannot continue with physical therapy due to the amount of pain that she is in.  She notes continued lateral ankle pain and pain with light touch diffusely throughout the left ankle and foot.  Wearing fracture boot.  She states she cannot walk and he is very miserable without the boot..                ROS: All systems reviewed are negative as they relate to the chief complaint within the history of present illness.  Patient denies fevers or chills.  Assessment & Plan: Visit Diagnoses:  1. Pain in left ankle and joints of left foot     Plan: Patient is a 46 year old female who presents for Worker's Compensation injury.  She has obtained a Clinical research associate as her Worker's Comp. claim was denied.  She has severe increase in her left ankle and foot pain that is mostly localized to the lateral aspect of the left ankle.  She tried to do physical therapy and was able to complete a session yesterday but had severe increase in her pain ever since that session and now she does not feel like she can complete any more physical therapy without being in excruciating pain.  In light of this, plan to proceed with MRI of the left ankle for further evaluation of occult fracture versus ligamentous injury.  Follow-up after MRI to review results.  Follow-Up Instructions: No follow-ups on file.    Orders:  Orders Placed This Encounter  Procedures   MR Ankle Left w/o contrast   No orders of the defined types were placed in this encounter.     Procedures: No procedures performed   Clinical Data: No additional findings.  Objective: Vital Signs: LMP 08/02/2021   Physical Exam:  Constitutional: Patient appears well-developed HEENT:  Head: Normocephalic Eyes:EOM are normal Neck: Normal range of motion Cardiovascular: Normal rate Pulmonary/chest: Effort normal Neurologic: Patient is alert Skin: Skin is warm Psychiatric: Patient has normal mood and affect  Ortho Exam: Ortho exam demonstrates left foot with 2+ DP pulse.  Intact ankle dorsiflexion, plantarflexion, inversion, eversion but was very small active range of motion.  She has severe tenderness over the medial and lateral malleoli with severe pain with stressing of the syndesmosis though this is found to be stable.  She has no ecchymosis noted.  No plantar ecchymosis.  Tenderness diffusely throughout the left ankle and foot to light touch.  No unilateral leg swelling compared with contralateral leg.  No calf tenderness.  Specialty Comments:  No specialty comments available.  Imaging: No results found.   PMFS History: Patient Active Problem List   Diagnosis Date Noted   Injury of left ankle 08/24/2021   Benign paroxysmal positional vertigo of right ear 07/27/2021  Anxiety 07/20/2021   Migraine with aura and without status migrainosus, not intractable 07/20/2021   Bipolar 1 disorder (HCC) 01/11/2021   Polysubstance (excluding opioids) dependence, daily use (HCC) 01/11/2021   Seizures (HCC) 01/11/2021   Tobacco use 01/11/2021   Past Medical History:  Diagnosis Date   Anxiety    Depression    Seizures (HCC)     Family History  Problem Relation Age of Onset   Heart disease Mother    Stroke Mother    Diabetes Mother    Asthma Father    Heart disease Father        open heart surgery   Stroke Father     Diabetes Father    Asthma Brother    Brain cancer Maternal Grandmother    Emphysema Maternal Grandfather    Cancer Paternal Grandmother        unsure of the type    Past Surgical History:  Procedure Laterality Date   ESOPHAGUS SURGERY     as a baby, 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 legs were bended inwards   TUBAL LIGATION     Social History   Occupational History   Not on file  Tobacco Use   Smoking status: Every Day    Packs/day: 0.50    Years: 30.00    Total pack years: 15.00    Types: Cigarettes   Smokeless tobacco: Never  Vaping Use   Vaping Use: Former  Substance and Sexual Activity   Alcohol use: Not Currently   Drug use: Not Currently    Types: "Crack" cocaine    Comment: quit in 2022   Sexual activity: Not on file

## 2021-09-11 ENCOUNTER — Encounter: Payer: 59 | Admitting: Physical Therapy

## 2021-09-12 ENCOUNTER — Encounter: Payer: Self-pay | Admitting: Neurology

## 2021-09-12 ENCOUNTER — Ambulatory Visit: Payer: 59 | Admitting: Neurology

## 2021-09-12 DIAGNOSIS — Z029 Encounter for administrative examinations, unspecified: Secondary | ICD-10-CM

## 2021-09-13 ENCOUNTER — Encounter: Payer: 59 | Admitting: Physical Therapy

## 2021-09-19 ENCOUNTER — Encounter: Payer: 59 | Admitting: Physical Therapy

## 2021-09-25 ENCOUNTER — Encounter: Payer: 59 | Admitting: Physical Therapy

## 2021-09-26 ENCOUNTER — Ambulatory Visit: Payer: 59 | Admitting: Orthopedic Surgery

## 2021-09-27 ENCOUNTER — Encounter: Payer: 59 | Admitting: Rehabilitative and Restorative Service Providers"

## 2021-10-02 ENCOUNTER — Encounter: Payer: 59 | Admitting: Rehabilitative and Restorative Service Providers"

## 2021-10-02 ENCOUNTER — Other Ambulatory Visit: Payer: Self-pay | Admitting: Nurse Practitioner

## 2021-10-02 DIAGNOSIS — G43109 Migraine with aura, not intractable, without status migrainosus: Secondary | ICD-10-CM

## 2021-10-04 ENCOUNTER — Ambulatory Visit: Payer: 59 | Admitting: Neurology

## 2021-10-04 ENCOUNTER — Encounter: Payer: 59 | Admitting: Rehabilitative and Restorative Service Providers"

## 2021-10-05 ENCOUNTER — Other Ambulatory Visit (HOSPITAL_COMMUNITY): Payer: Self-pay

## 2021-10-07 ENCOUNTER — Ambulatory Visit (HOSPITAL_COMMUNITY)
Admission: RE | Admit: 2021-10-07 | Discharge: 2021-10-07 | Disposition: A | Discharge status: Home / Self Care | Payer: 59 | Source: Ambulatory Visit | Attending: Surgical | Admitting: Surgical

## 2021-10-07 DIAGNOSIS — M25572 Pain in left ankle and joints of left foot: Secondary | ICD-10-CM | POA: Insufficient documentation

## 2021-10-09 ENCOUNTER — Telehealth: Payer: Self-pay | Admitting: Nurse Practitioner

## 2021-10-09 NOTE — Telephone Encounter (Signed)
Per Access Nurse, they educated patient on what can happen after donating plasma and ways to correct her symptoms at home.

## 2021-10-09 NOTE — Telephone Encounter (Signed)
Nothing further is needed. I did call and left patient a detailed message asking for a call back to update [place] on how she was feeling since this morning

## 2021-10-09 NOTE — Telephone Encounter (Signed)
Patient states she is doing better and nothing further is needed.

## 2021-10-09 NOTE — Telephone Encounter (Signed)
Patient called and said that she donated plasma yesterday and that today she is feeling really weak, her head hurts, and she feels like she will throw up when she stands up.She said it hasnt happended before when she has donated. I transferred her to the triage nurse for further eval

## 2021-10-09 NOTE — Telephone Encounter (Signed)
Sending note to Audria Nine NP and Anastasiya CMA.

## 2021-10-09 NOTE — Telephone Encounter (Signed)
[facility] - Client TELEPHONE ADVICE RECORD [facility] Patient Name: Lacey Miles [facility] Gender: Female DOB: 1975/07/23 Age: 46 Y 7 M 17 D Return Phone Number: [phone] (Primary) Address: City/ State/ Zip: [place] [address] Client [facility] - Client Client Site [facility] - Day Provider Audria Nine- NP Contact Type Call Who Is Calling Patient / Member / Family / Caregiver Call Type Triage / Clinical Relationship To Patient Self Return Phone Number [phone] (Primary) Chief Complaint Weakness, Generalized Reason for Call Symptomatic / Request for Health Information Initial Comment Caller states she donated plasma yesterday, c/ o weakness, headache, feels like she is going to vomit when she stands up. Translation No Nurse Assessment Nurse: Lily Kocher, RN, Adriana Date/Time (Eastern Time): 10/09/2021 8:41:11 AM Confirm and document reason for call. If symptomatic, describe symptoms. ---pt states that she donated plasma yesterday. pt feels dizzy upon standing, nauseous, also headache. pain 8/10 has not taken any pain meds. Does the patient have any new or worsening symptoms? ---Yes Will a triage be completed? ---Yes Related visit to physician within the last 2 weeks? ---No Does the PT have any chronic conditions? (i.e. diabetes, asthma, this includes High risk factors for pregnancy, etc.) ---No Is the patient pregnant or possibly pregnant? (Ask all females between the ages of 71-55) ---No Is this a behavioral health or substance abuse call? ---No Guidelines Guideline Title Affirmed Question Affirmed Notes Nurse Date/Time (Eastern Time) Dizziness - Lightheadedness Dizziness caused by not drinking enough fluids Lily Kocher, RN, Adriana 10/09/2021 8:43:59 AM Disp. Time Lamount Cohen Time) Disposition Final User 10/09/2021 8:51:41 AM Home Care Yes Lily Kocher, RN, Adriana PLEASE NOTE: All timestamps  contained within this report are represented as [place] Standard Time. CONFIDENTIALTY NOTICE: This fax transmission is intended only for the addressee. It contains information that is legally privileged, confidential or otherwise protected from use or disclosure. If you are not the intended recipient, you are strictly prohibited from reviewing, disclosing, copying using or disseminating any of this information or taking any action in reliance on or regarding this information. If you have received this fax in error, please notify us immediately by telephone so that we can arrange for its return to [place]. Phone: [phone], Toll-Free: [phone], Fax: [phone] Page: 2 of 2 Call Id: [id number] Final Disposition 10/09/2021 8:51:41 AM Home Care Yes Lily Kocher, RN, Ricki Rodriguez Caller Disagree/Comply Comply Caller Understands Yes PreDisposition Call Doctor Care Advice Given Per Guideline HOME CARE: * You should be able to treat this at home. REASSURANCE AND EDUCATION - DIZZINESS FROM NOT DRINKING ENOUGH LIQUIDS: * Not drinking enough fluids and being a little dehydrated is a common cause of temporary dizziness. CARE ADVICE given per Dizziness (Adult) guideline. CALL BACK IF: * After 2 hours of rest and fluids AND still feeling dizzy. DRINK FLUIDS: * Drink several glasses of fruit juice, other clear fluids or water. * This will improve hydration and blood glucose. * If the weather is hot or you have a fever, make sure the fluids are cold. LIE DOWN AND REST: * Lie down with feet elevated for 1 hour. * This will improve circulation and increase blood flow to the brain. COOL OFF: * If the weather is hot, apply a cold compress to the forehead. * Or take a cool shower or bath. PREVENTION - DIZZINESS: * Drink extra water and eat some salty foods during exercise or hot weather. * Avoid over-heating during hot weather. * Eat healthy. Try to have  regular mealtimes. * Get adequate sleep and rest. * Passes out (faints).  * You become worse Comments User: Jerilynn Birkenhead, RN Date/Time Lamount Cohen Time): 10/09/2021 8:46:59 AM states was out in the sun after the donation User: Jerilynn Birkenhead, RN Date/Time Lamount Cohen Time): 10/09/2021 8:47:51 AM Potential Side Effects The U.S. Food and Drug Administration regulates plasma collection in the [place]. For most people, donating plasma does not cause any side effects, but some donors can experience fatigue, bruising, bleeding, or dehydration. Additionally, you may feel dizzy or lightheaded. While not typical, fainting can also occur. It's rare, but more serious infections or reactions can occur, which can be treated. If you experience severe symptoms, contact a doctor immediately. If you experience general side effects, it can help to rest, drink more water, and eat more iron-rich foods. For dizziness or fainting, lie down or sit with your head between your knees. For bleeding, raise your arm, apply pressure, then place a bandage over the area for several hours. https:// [employer] %[email or web address]%20plasma,occur%2C%20which%20can%20be%20treated

## 2021-10-09 NOTE — Telephone Encounter (Signed)
Does this need anything from me?

## 2021-10-16 ENCOUNTER — Ambulatory Visit: Payer: 59 | Admitting: Neurology

## 2021-10-16 ENCOUNTER — Ambulatory Visit (INDEPENDENT_AMBULATORY_CARE_PROVIDER_SITE_OTHER): Payer: 59 | Admitting: Nurse Practitioner

## 2021-10-16 VITALS — BP 106/72 | HR 77 | Temp 97.5°F | Resp 14 | Wt 141.1 lb

## 2021-10-16 DIAGNOSIS — R110 Nausea: Secondary | ICD-10-CM | POA: Insufficient documentation

## 2021-10-16 DIAGNOSIS — H9202 Otalgia, left ear: Secondary | ICD-10-CM | POA: Diagnosis not present

## 2021-10-16 DIAGNOSIS — R509 Fever, unspecified: Secondary | ICD-10-CM | POA: Insufficient documentation

## 2021-10-16 DIAGNOSIS — R5383 Other fatigue: Secondary | ICD-10-CM

## 2021-10-16 DIAGNOSIS — H60392 Other infective otitis externa, left ear: Secondary | ICD-10-CM

## 2021-10-16 DIAGNOSIS — R6883 Chills (without fever): Secondary | ICD-10-CM | POA: Insufficient documentation

## 2021-10-16 DIAGNOSIS — H6090 Unspecified otitis externa, unspecified ear: Secondary | ICD-10-CM | POA: Insufficient documentation

## 2021-10-16 LAB — CBC
HCT: 40.3 % (ref 36.0–46.0)
Hemoglobin: 13.2 g/dL (ref 12.0–15.0)
MCHC: 32.8 g/dL (ref 30.0–36.0)
MCV: 92.5 fl (ref 78.0–100.0)
Platelets: 112 10*3/uL — ABNORMAL LOW (ref 150.0–400.0)
RBC: 4.35 Mil/uL (ref 3.87–5.11)
RDW: 14.1 % (ref 11.5–15.5)
WBC: 5.2 10*3/uL (ref 4.0–10.5)

## 2021-10-16 LAB — BASIC METABOLIC PANEL
BUN: 12 mg/dL (ref 6–23)
CO2: 24 mEq/L (ref 19–32)
Calcium: 9.1 mg/dL (ref 8.4–10.5)
Chloride: 110 mEq/L (ref 96–112)
Creatinine, Ser: 0.89 mg/dL (ref 0.40–1.20)
GFR: 78.72 mL/min (ref >60.00)
Glucose, Bld: 80 mg/dL (ref 70–99)
Potassium: 4.1 mEq/L (ref 3.5–5.1)
Sodium: 140 mEq/L (ref 135–145)

## 2021-10-16 MED ORDER — OFLOXACIN 0.3 % OT SOLN: 10.0000 [drp] | Freq: Every day | OTIC | 0 refills | Status: DC

## 2021-10-16 NOTE — Assessment & Plan Note (Signed)
Patient can continue taking over-the-counter analgesics if beneficial pending send off COVID test quarantine until results are available.  Work note provided

## 2021-10-16 NOTE — Patient Instructions (Addendum)
Nice to see you today I will be in touch with the labs once I have them. Quarantine until the swab comes back and you hear from me I sent in ear drop that you will put in your ear once a day for a week Follow up if no improvement   Keep your physical appointment that is in September

## 2021-10-16 NOTE — Assessment & Plan Note (Signed)
At home COVID test negative pending send off COVID test in office.  Work note provided for patient to quarantine until lab result is available

## 2021-10-16 NOTE — Assessment & Plan Note (Signed)
Likely related to otitis externa

## 2021-10-16 NOTE — Progress Notes (Signed)
Acute Office Visit  Subjective:     Patient ID: Lacey Miles, female    DOB: 1975/08/19, 46 y.o.   MRN: [id number]  Chief Complaint  Patient presents with   Fatigue    And had chills and sweats started yesterday 10/15/21. Some pain in the left ear. No fever. Her work required her to get checked out before coming in to work.    HPI Patient is in today for [facility]  Started working for [employer].  States that she worked Sunday night and felt poor. States that she came home yesterday and slept most of the day. States she kept waking up with cold sweats through out the day. States that her left ear started hurting Sunday after noon.  No sick contact.  States that she took at home covid test that was negative last night.  Pfizer x2 and one booster  Review of Systems  Constitutional:  Positive for chills, fever and malaise/fatigue.  HENT:  Positive for ear pain (fells like fluid). Negative for sinus pain and sore throat.   Respiratory:  Negative for shortness of breath.   Cardiovascular:  Negative for chest pain.  Gastrointestinal:  Positive for nausea. Negative for abdominal pain, diarrhea and vomiting.  Musculoskeletal:  Negative for joint pain and myalgias.  Neurological:  Positive for headaches.        Objective:    BP 106/72   Pulse 77   Temp (!) 97.5 F (36.4 C)   Resp 14   Wt 141 lb 2 oz (64 kg)   LMP 10/02/2021   SpO2 98%   BMI 20.25 kg/m    Physical Exam Vitals and nursing note reviewed.  Constitutional:      Appearance: Normal appearance.  HENT:     Right Ear: Tympanic membrane, ear canal and external ear normal. There is no impacted cerumen.     Left Ear: Tympanic membrane and external ear normal. There is no impacted cerumen.     Ears:     Comments: Erythema in the canal on the left side. Tenderness with tugging on auricle     Mouth/Throat:     Mouth: Mucous membranes are moist.     Pharynx: Oropharynx is clear.  Cardiovascular:      Rate and Rhythm: Normal rate and regular rhythm.     Heart sounds: Normal heart sounds.  Pulmonary:     Effort: Pulmonary effort is normal.     Breath sounds: Normal breath sounds.  Lymphadenopathy:     Cervical: Cervical adenopathy present.  Neurological:     Mental Status: She is alert.     No results found for any visits on 10/16/21.      Assessment & Plan:   Problem List Items Addressed This Visit       Nervous and Auditory   Otitis externa    Given physical exam patient's presentation and symptoms would like to treat otitis externa.  Ofloxacin 10 drops daily for 1 week in left ear.  Follow-up if no improvement        Other   Other fatigue   Relevant Orders   CBC   Basic metabolic panel   Novel Coronavirus, NAA ([facility])   Subjective fever - Primary    Patient can continue taking over-the-counter analgesics if beneficial pending send off COVID test quarantine until results are available.  Work note provided      Relevant Orders   Novel Coronavirus, NAA ([facility])   Chills  At home COVID test negative pending send off COVID test in office.  Work note provided for patient to quarantine until lab result is available      Relevant Orders   Novel Coronavirus, NAA ([facility])   Nausea   Relevant Orders   Novel Coronavirus, NAA ([facility])   Ear pain, left    Likely related to otitis externa      Relevant Medications   ofloxacin (FLOXIN) 0.3 % OTIC solution    Meds ordered this encounter  Medications   ofloxacin (FLOXIN) 0.3 % OTIC solution    Sig: Place 10 drops into the left ear daily. For 7 days    Dispense:  5 mL    Refill:  0    Order Specific Question:   Supervising Provider    Answer:   Roxy Manns A [[id number]]    Return if symptoms worsen or fail to improve, for as scheduled.  Audria Nine, NP

## 2021-10-16 NOTE — Assessment & Plan Note (Signed)
Given physical exam patient's presentation and symptoms would like to treat otitis externa.  Ofloxacin 10 drops daily for 1 week in left ear.  Follow-up if no improvement

## 2021-10-17 LAB — SPECIMEN STATUS REPORT

## 2021-10-17 LAB — NOVEL CORONAVIRUS, NAA: SARS-CoV-2, NAA: NOT DETECTED

## 2021-10-18 ENCOUNTER — Encounter: Payer: Self-pay | Admitting: Nurse Practitioner

## 2021-10-19 ENCOUNTER — Ambulatory Visit: Payer: 59 | Admitting: Orthopedic Surgery

## 2021-10-19 DIAGNOSIS — M25572 Pain in left ankle and joints of left foot: Secondary | ICD-10-CM

## 2021-10-20 ENCOUNTER — Encounter: Payer: Self-pay | Admitting: Orthopedic Surgery

## 2021-10-20 NOTE — Progress Notes (Signed)
Office Visit Note   Patient: Lacey Miles           Date of Birth: 1975-05-16           MRN: [id number] Visit Date: 10/19/2021 Requested by: Eden Emms, NP [address] [place],  [place] [address] PCP: Eden Emms, NP  Subjective: Chief Complaint  Patient presents with   Left Ankle - Follow-up  Denies any instability in the ankle.  HPI: Lacey Miles is a 46 year old patient with left ankle pain.  Since she was last seen she has had an MRI scan of her left ankle which was essentially normal.  She feels like her foot turns out.  Had an injury at work.  Not taking any medication.              ROS: All systems reviewed are negative as they relate to the chief complaint within the history of present illness.  Patient denies  fevers or chills.   Assessment & Plan: Visit Diagnoses:  1. Pain in left ankle and joints of left foot     Plan: Impression is left ankle pain with normal MRI scan.  Having some pain on both the medial and lateral side of the ankle but structurally the ankle is intact.  Physical therapy may be beneficial in this situation for some strengthening and proprioception exercises.  Nothing surgical at this time.  Follow-up with [place] as needed.  Follow-Up Instructions: Return if symptoms worsen or fail to improve.   Orders:  No orders of the defined types were placed in this encounter.  No orders of the defined types were placed in this encounter.     Procedures: No procedures performed   Clinical Data: No additional findings.  Objective: Vital Signs: LMP 10/02/2021   Physical Exam:   Constitutional: Patient appears well-developed HEENT:  Head: Normocephalic Eyes:EOM are normal Neck: Normal range of motion Cardiovascular: Normal rate Pulmonary/chest: Effort normal Neurologic: Patient is alert Skin: Skin is warm Psychiatric: Patient has normal mood and affect   Ortho Exam: Ortho exam demonstrates palpable pedal pulses with intact  extensor mechanism of the knee.  Palpable nontender and intact anterior tibial posterior tibial peroneal and Achilles tendons.  Ankle range of motion is full with dorsiflexion plantarflexion inversion and eversion.  No pain with pronation supination of the forefoot.  No tenderness of the fifth metatarsal base.  Specialty Comments:  No specialty comments available.  Imaging: No results found.   PMFS History: Patient Active Problem List   Diagnosis Date Noted   Subjective fever 10/16/2021   Chills 10/16/2021   Nausea 10/16/2021   Ear pain, left 10/16/2021   Otitis externa 10/16/2021   Injury of left ankle 08/24/2021   Benign paroxysmal positional vertigo of right ear 07/27/2021   Other fatigue 07/27/2021   Anxiety 07/20/2021   Migraine with aura and without status migrainosus, not intractable 07/20/2021   Bipolar 1 disorder (HCC) 01/11/2021   Polysubstance (excluding opioids) dependence, daily use (HCC) 01/11/2021   Seizures (HCC) 01/11/2021   Tobacco use 01/11/2021   Past Medical History:  Diagnosis Date   Anxiety    Depression    Seizures (HCC)     Family History  Problem Relation Age of Onset   Heart disease Mother    Stroke Mother    Diabetes Mother    Asthma Father    Heart disease Father        open heart surgery   Stroke Father  Diabetes Father    Asthma Brother    Brain cancer Maternal Grandmother    Emphysema Maternal Grandfather    Cancer Paternal Grandmother        unsure of the type    Past Surgical History:  Procedure Laterality Date   ESOPHAGUS SURGERY     as a baby, 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 legs were bended inwards   TUBAL LIGATION     Social History   Occupational History   Not on file  Tobacco Use   Smoking status: Every Day    Packs/day: 0.50    Years: 30.00    Total pack years: 15.00     Types: Cigarettes   Smokeless tobacco: Never  Vaping Use   Vaping Use: Former  Substance and Sexual Activity   Alcohol use: Not Currently   Drug use: Not Currently    Types: "Crack" cocaine    Comment: quit in 2022   Sexual activity: Not on file

## 2021-11-14 ENCOUNTER — Ambulatory Visit: Payer: 59 | Admitting: Neurology

## 2021-11-14 ENCOUNTER — Encounter: Payer: Self-pay | Admitting: Neurology

## 2021-11-14 VITALS — BP 104/63 | HR 86 | Ht 70.0 in | Wt 136.0 lb

## 2021-11-14 DIAGNOSIS — G40009 Localization-related (focal) (partial) idiopathic epilepsy and epileptic syndromes with seizures of localized onset, not intractable, without status epilepticus: Secondary | ICD-10-CM | POA: Diagnosis not present

## 2021-11-14 DIAGNOSIS — G43009 Migraine without aura, not intractable, without status migrainosus: Secondary | ICD-10-CM | POA: Diagnosis not present

## 2021-11-14 DIAGNOSIS — R519 Headache, unspecified: Secondary | ICD-10-CM

## 2021-11-14 MED ORDER — LEVETIRACETAM 750 MG PO TABS: 750.0000 mg | ORAL_TABLET | Freq: Two times a day (BID) | ORAL | 3 refills | Status: AC

## 2021-11-14 MED ORDER — DIVALPROEX SODIUM ER 500 MG PO TB24: ORAL_TABLET | ORAL | 3 refills | Status: AC

## 2021-11-14 MED ORDER — TOPIRAMATE 50 MG PO TABS: ORAL_TABLET | ORAL | 3 refills | Status: AC

## 2021-11-14 NOTE — Patient Instructions (Addendum)
Good to meet you.  Schedule MRI brain with and without contrast  2. Schedule EEG  3. Increase Depakote ER 500mg  to 1 tablet twice a day  4. Continue Keppra (Levetiracetam) 750mg  twice a day  5. Continue Topiramate 50mg  daily  6. Minimize Imitrex to 2-3 times a week otherwise rebound/daily headaches occur  7. Follow-up in 3 months, call for any changes   Seizure Precautions: 1. If medication has been prescribed for you to prevent seizures, take it exactly as directed.  Do not stop taking the medicine without talking to your doctor first, even if you have not had a seizure in a long time.   2. Avoid activities in which a seizure would cause danger to yourself or to others.  Don't operate dangerous machinery, swim alone, or climb in high or dangerous places, such as on ladders, roofs, or girders.  Do not drive unless your doctor says you may.  3. If you have any warning that you may have a seizure, lay down in a safe place where you can't hurt yourself.    4.  No driving for 6 months from last seizure, as per [facility].   Please refer to the following link on the Epile[employer] of America's website for more information: http://www.epilepsyfoundation.org/answerplace/Social/driving/drivingu.cfm   5.  Maintain good sleep hygiene. Avoid alcohol.  6.  Notify your neurology if you are planning pregnancy or if you become pregnant.  7.  Contact your doctor if you have any problems that may be related to the medicine you are taking.  8.  Call 911 and bring the patient back to the ED if:        A.  The seizure lasts longer than 5 minutes.       B.  The patient doesn't awaken shortly after the seizure  C.  The patient has new problems such as difficulty seeing, speaking or moving  D.  The patient was injured during the seizure  E.  The patient has a temperature over 102 F (39C)  F.  The patient vomited and now is having trouble breathing
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
## 2021-11-20 ENCOUNTER — Other Ambulatory Visit (HOSPITAL_COMMUNITY): Payer: Self-pay

## 2021-11-21 ENCOUNTER — Encounter: Payer: Self-pay | Admitting: Nurse Practitioner

## 2021-11-21 ENCOUNTER — Other Ambulatory Visit (HOSPITAL_COMMUNITY)
Admission: RE | Admit: 2021-11-21 | Discharge: 2021-11-21 | Disposition: A | Discharge status: Home / Self Care | Payer: Commercial Managed Care - HMO | Source: Ambulatory Visit | Attending: Nurse Practitioner | Admitting: Nurse Practitioner

## 2021-11-21 ENCOUNTER — Ambulatory Visit (INDEPENDENT_AMBULATORY_CARE_PROVIDER_SITE_OTHER): Payer: Commercial Managed Care - HMO | Admitting: Nurse Practitioner

## 2021-11-21 VITALS — BP 108/72 | HR 101 | Temp 97.5°F | Resp 16 | Ht 70.0 in | Wt 136.2 lb

## 2021-11-21 DIAGNOSIS — R569 Unspecified convulsions: Secondary | ICD-10-CM

## 2021-11-21 DIAGNOSIS — Z124 Encounter for screening for malignant neoplasm of cervix: Secondary | ICD-10-CM | POA: Diagnosis not present

## 2021-11-21 DIAGNOSIS — Z23 Encounter for immunization: Secondary | ICD-10-CM

## 2021-11-21 DIAGNOSIS — T7491XA Unspecified adult maltreatment, confirmed, initial encounter: Secondary | ICD-10-CM

## 2021-11-21 DIAGNOSIS — F319 Bipolar disorder, unspecified: Secondary | ICD-10-CM

## 2021-11-21 DIAGNOSIS — Z0000 Encounter for general adult medical examination without abnormal findings: Secondary | ICD-10-CM | POA: Insufficient documentation

## 2021-11-21 DIAGNOSIS — Z1231 Encounter for screening mammogram for malignant neoplasm of breast: Secondary | ICD-10-CM

## 2021-11-21 DIAGNOSIS — R87610 Atypical squamous cells of undetermined significance on cytologic smear of cervix (ASC-US): Secondary | ICD-10-CM | POA: Diagnosis not present

## 2021-11-21 DIAGNOSIS — G43109 Migraine with aura, not intractable, without status migrainosus: Secondary | ICD-10-CM
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
## 2021-11-21 NOTE — Patient Instructions (Signed)
Nice to see you today I have put in the order for the social worker and the pharmacist to reach out and offer some assistance I would go to the local police station for a protection order Follow up in 1 year for your next physical, sooner if you need me

## 2021-11-21 NOTE — Assessment & Plan Note (Signed)
Discussed age-appropriate immunizations and screening exams. 

## 2021-11-21 NOTE — Assessment & Plan Note (Signed)
Stable

## 2021-11-21 NOTE — Assessment & Plan Note (Signed)
Patient currently maintained on topiramate and sumatriptan.  Continue taking medication as prescribed

## 2021-11-21 NOTE — Assessment & Plan Note (Signed)
Patient is followed by neurology.  Having difficulty affording medications.  Care coordination consulted.  Will use pharmacy and social work patient was informed that this is a service that offered him a have a cost associated with it.

## 2021-11-22 LAB — LIPID PANEL
Cholesterol: 170 mg/dL (ref 0–200)
HDL: 52.7 mg/dL (ref >39.00)
LDL Cholesterol: 100 mg/dL — ABNORMAL HIGH (ref 0–99)
NonHDL: 117.01
Total CHOL/HDL Ratio: 3
Triglycerides: 84 mg/dL (ref 0.0–149.0)
VLDL: 16.8 mg/dL (ref 0.0–40.0)

## 2021-11-22 LAB — HEPATIC FUNCTION PANEL
ALT: 8 U/L (ref 0–35)
AST: 11 U/L (ref 0–37)
Albumin: 4.1 g/dL (ref 3.5–5.2)
Alkaline Phosphatase: 53 U/L (ref 39–117)
Bilirubin, Direct: 0.1 mg/dL (ref 0.0–0.3)
Total Bilirubin: 0.5 mg/dL (ref 0.2–1.2)
Total Protein: 6.3 g/dL (ref 6.0–8.3)

## 2021-11-22 LAB — CBC
HCT: 41.7 % (ref 36.0–46.0)
Hemoglobin: 13.8 g/dL (ref 12.0–15.0)
MCHC: 33 g/dL (ref 30.0–36.0)
MCV: 91.9 fl (ref 78.0–100.0)
Platelets: 160 10*3/uL (ref 150.0–400.0)
RBC: 4.54 Mil/uL (ref 3.87–5.11)
RDW: 13.7 % (ref 11.5–15.5)
WBC: 7.8 10*3/uL (ref 4.0–10.5)

## 2021-11-22 LAB — TSH: TSH: 0.89 u[IU]/mL (ref 0.35–5.50)

## 2021-11-26 ENCOUNTER — Other Ambulatory Visit (HOSPITAL_COMMUNITY): Payer: Self-pay

## 2021-11-29 LAB — CYTOLOGY - PAP
Chlamydia: NEGATIVE
Comment: NEGATIVE
Comment: NEGATIVE
Comment: NEGATIVE
Comment: NORMAL
Diagnosis: UNDETERMINED — AB
High risk HPV: NEGATIVE
Neisseria Gonorrhea: NEGATIVE
Trichomonas: POSITIVE — AB

## 2021-11-30 ENCOUNTER — Other Ambulatory Visit: Payer: Self-pay | Admitting: Nurse Practitioner

## 2021-11-30 ENCOUNTER — Telehealth: Payer: Self-pay

## 2021-11-30 DIAGNOSIS — A599 Trichomoniasis, unspecified: Secondary | ICD-10-CM

## 2021-11-30 MED ORDER — METRONIDAZOLE 500 MG PO TABS: 500.0000 mg | ORAL_TABLET | Freq: Two times a day (BID) | ORAL | 0 refills | Status: AC

## 2021-11-30 NOTE — Telephone Encounter (Signed)
If The patient is taking any over-the-counter nasal sprays please discontinue.  She can get over-the-counter nasal saline to help moisten the nose to prevent it from bleeding.  As far as the other complaints She will need an office visit or go see an urgent care.

## 2021-11-30 NOTE — Telephone Encounter (Signed)
[facility] - Client TELEPHONE ADVICE RECORD [facility] Patient Name: Lacey Miles [facility] Gender: Female DOB: March 21, 1975 Age: 46 Y 9 M 7 D Return Phone Number: [phone] (Primary) Address: City/ State/ Zip: [place] [address] Client [facility] - Client Client Site [facility] - Day Provider Audria Nine- NP Contact Type Call Who Is Calling Patient / Member / Family / Caregiver Call Type Triage / Clinical Relationship To Patient Self Return Phone Number [phone] (Primary) Chief Complaint Nosebleed Reason for Call Symptomatic / Request for Health Information Initial Comment Caller states having nose bleeds 4 days-off/on throughout day for 5-10 mins at a time; not yet today; no available appts; pressure in eyes; nose hurts; headache; problem w/foot swelling; on the job injury few months back-for foot; Translation No Nurse Assessment Nurse: Toni Arthurs, RN, Corrie Dandy Date/Time (Eastern Time): 11/30/2021 9:08:10 AM Confirm and document reason for call. If symptomatic, describe symptoms. ---Caller states she may have a bad sinus infection. Sx started 4 days ago. Nose/sinus pain. No fever. Has had nose bleed x 1 with blood in mucus also. Had a recent injury to foot and it is swelling again after work yesterday. Does the patient have any new or worsening symptoms? ---Yes Will a triage be completed? ---Yes Related visit to physician within the last 2 weeks? ---No Does the PT have any chronic conditions? (i.e. diabetes, asthma, this includes High risk factors for pregnancy, etc.) ---Yes List chronic conditions. ---Hx of Seizure (last was 3 mnths ago) Is the patient pregnant or possibly pregnant? (Ask all females between the ages of 2-55) ---No Is this a behavioral health or substance abuse call? ---No Guidelines Guideline Title Affirmed Question Affirmed Notes Nurse Date/Time (Eastern Time) Headache  Severe pain in one eye Mervyn Gay 11/30/2021 9:11:10 AM PLEASE NOTE: All timestamps contained within this report are represented as [place] Standard Time. CONFIDENTIALTY NOTICE: This fax transmission is intended only for the addressee. It contains information that is legally privileged, confidential or otherwise protected from use or disclosure. If you are not the intended recipient, you are strictly prohibited from reviewing, disclosing, copying using or disseminating any of this information or taking any action in reliance on or regarding this information. If you have received this fax in error, please notify us immediately by telephone so that we can arrange for its return to [place]. Phone: [phone], Toll-Free: [phone], Fax: [phone] Page: 2 of 2 Call Id: [id number] Disp. Time Lamount Cohen Time) Disposition Final User 11/30/2021 9:13:37 AM Go to ED Now Yes Toni Arthurs, RN, [facility] Final Disposition 11/30/2021 9:13:37 AM Go to ED Now Yes Toni Arthurs, RN, Jenelle Mages Disagree/Comply Disagree Caller Understands Yes PreDisposition Call Doctor Care Advice Given Per Guideline GO TO ED NOW: * You need to be seen in the Emergency Department. Comments User: Alphia Moh, RN Date/Time Lamount Cohen Time): 11/30/2021 9:15:01 AM Caller states she will go to local UC instead of ED due to transportation. Referrals GO TO FACILITY UNDECIDE

## 2021-11-30 NOTE — Telephone Encounter (Signed)
Left message to call back to follow up. Also see lab result note re-pap smear 

## 2021-11-30 NOTE — Telephone Encounter (Signed)
Sending note to Audria Nine NP and Anastasiya CMA.

## 2021-12-03 NOTE — Telephone Encounter (Signed)
Left message to call back to follow up. Also see lab result note re-pap smear

## 2021-12-05 NOTE — Telephone Encounter (Signed)
Left message for patient and her mom to call back. See lab result note also.

## 2021-12-10 NOTE — Telephone Encounter (Signed)
Letter mailed to the patient

## 2021-12-19 ENCOUNTER — Encounter: Payer: Self-pay | Admitting: Primary Care

## 2021-12-19 ENCOUNTER — Ambulatory Visit (INDEPENDENT_AMBULATORY_CARE_PROVIDER_SITE_OTHER): Payer: Commercial Managed Care - HMO | Admitting: Primary Care

## 2021-12-19 VITALS — BP 118/58 | HR 101 | Temp 98.1°F | Ht 70.0 in | Wt 139.0 lb

## 2021-12-19 DIAGNOSIS — N920 Excessive and frequent menstruation with regular cycle: Secondary | ICD-10-CM

## 2021-12-19 DIAGNOSIS — R42 Dizziness and giddiness: Secondary | ICD-10-CM

## 2021-12-19 DIAGNOSIS — A5901 Trichomonal vulvovaginitis: Secondary | ICD-10-CM | POA: Diagnosis not present

## 2021-12-19 LAB — CBC
HCT: 42 % (ref 36.0–46.0)
Hemoglobin: 13.7 g/dL (ref 12.0–15.0)
MCHC: 32.6 g/dL (ref 30.0–36.0)
MCV: 93.7 fl (ref 78.0–100.0)
Platelets: 174 10*3/uL (ref 150.0–400.0)
RBC: 4.48 Mil/uL (ref 3.87–5.11)
RDW: 14.8 % (ref 11.5–15.5)
WBC: 7.6 10*3/uL (ref 4.0–10.5)

## 2021-12-19 LAB — IBC + FERRITIN
Ferritin: 10.1 ng/mL (ref 10.0–291.0)
Iron: 100 ug/dL (ref 42–145)
Saturation Ratios: 23.4 % (ref 20.0–50.0)
TIBC: 427 ug/dL (ref 250.0–450.0)
Transferrin: 305 mg/dL (ref 212.0–360.0)

## 2021-12-19 MED ORDER — MECLIZINE HCL 12.5 MG PO TABS: 12.5000 mg | ORAL_TABLET | Freq: Three times a day (TID) | ORAL | 0 refills | Status: AC | PRN

## 2021-12-19 NOTE — Assessment & Plan Note (Signed)
Incidental finding on Pap smear from early September 2023. Asymptomatic today.  Repeat trichomonas testing today. We will treat if positive.

## 2021-12-19 NOTE — Assessment & Plan Note (Signed)
Could be multifactorial as symptoms do somewhat represent vertigo, however she is bleeding quite heavily with her menstrual cycle.  Checking CBC and iron studies today. Start meclizine 12.5 mg 3 times daily as needed.  Repeat testing for trichomonas evaluation given positive result 1 month ago.  Return precautions provided.

## 2021-12-19 NOTE — Patient Instructions (Signed)
You may take meclizine 12.5 mg tablets 3 times daily as needed for dizziness.  Stop by the lab prior to leaving today. I will notify you of your results once received.   It was a pleasure meeting you!

## 2021-12-19 NOTE — Assessment & Plan Note (Addendum)
Could certainly be contributing to dizziness and symptoms today.  CBC and iron studies pending today. Consider further work-up with transvaginal ultrasound if bleeding persists and is prolonged. Pap smear results reviewed with patient today.

## 2021-12-19 NOTE — Progress Notes (Signed)
Subjective:    Patient ID: Lacey Miles, female    DOB: 02-Feb-1976, 46 y.o.   MRN: [id number]  Dizziness Associated symptoms include nausea and vomiting. Pertinent negatives include no abdominal pain.    Lacey Miles is a very pleasant 46 y.o. female patient of Matt cable, NP with a history of migraines, bipolar 1 disorder, seizures, vertigo, anxiety, nausea who presents today to discuss dizziness.  Symptom onset five days ago with felt a slight room spinning sensation while sweeping the floor. She put her hands onto her face and she noticed a dose bleed with a large clot. She thinks she had mild sinus pressure at the time.   Last night during work she noticed slight room spinning sensation, felt nausea, went home. After she got home from work, she rose from a seated position, felt the room spinning, then felt nausea and had 6-7 episodes of (dry heaving mostly).   Today she's still feeling about the same, room spinning sensation occurs when she stands. She is slightly nauseated, has had dry heaving.  Currently menstruating, very heavy flow for the last three days which is not normal for her. History of irregular cycles, last year she bleed consistently for 38 days. She underwent a tubal ligation years ago. She is sexually active.   She denies abdominal pain, diarrhea, constipation, changes in appetite.   Of note, she saw her PCP in early September, pap smear showed evidence of trichomonas, PCP was unable to get in touch with her about this. Today she denies vaginal itching, vaginal discharge.   Review of Systems  Respiratory:  Negative for shortness of breath.   Cardiovascular:  Negative for palpitations.  Gastrointestinal:  Positive for nausea and vomiting. Negative for abdominal pain, blood in stool, constipation and diarrhea.  Genitourinary:  Negative for vaginal discharge.  Neurological:  Positive for dizziness.         Past Medical History:  Diagnosis Date    Anxiety    Depression    Seizures (HCC)     Social History   Socioeconomic History   Marital status: Divorced    Spouse name: Not on file   Number of children: 3   Years of education: Not on file   Highest education level: Not on file  Occupational History   Not on file  Tobacco Use   Smoking status: Every Day    Packs/day: 0.50    Years: 30.00    Total pack years: 15.00    Types: Cigarettes   Smokeless tobacco: Never  Vaping Use   Vaping Use: Former  Substance and Sexual Activity   Alcohol use: Not Currently   Drug use: Not Currently    Types: "Crack" cocaine    Comment: quit in 2022   Sexual activity: Not on file  Other Topics Concern   Not on file  Social History Narrative   Fulltime: Conservation officer, nature for [employer]   Right handed   Has kids and grand kids    Social Determinants of Health   Financial Resource Strain: Not on file  Food Insecurity: Not on file  Transportation Needs: Not on file  Physical Activity: Not on file  Stress: Not on file  Social Connections: Not on file  Intimate Partner Violence: Not on file    Past Surgical History:  Procedure Laterality Date   ESOPHAGUS SURGERY     as a baby, 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 legs were bended inwards   TUBAL LIGATION      Family History  Problem Relation Age of Onset   Heart disease Mother    Stroke Mother    Diabetes Mother    Asthma Father    Heart disease Father        open heart surgery   Stroke Father    Diabetes Father    Asthma Brother    Brain cancer Maternal Grandmother    Emphysema Maternal Grandfather    Cancer Paternal Grandmother        unsure of the type    Allergies  Allergen Reactions   Minocycline Anaphylaxis   Tomato Rash   Trazodone And Nefazodone Anaphylaxis   Tramadol     Current Outpatient Medications on File Prior to  Visit  Medication Sig Dispense Refill   divalproex (DEPAKOTE ER) 500 MG 24 hr tablet Take 1 tablet twice a day 180 tablet 3   levETIRAcetam (KEPPRA) 750 MG tablet Take 1 tablet (750 mg total) by mouth 2 (two) times daily. 180 tablet 3   omeprazole (PRILOSEC) 20 MG capsule Take 1 capsule (20 mg total) by mouth daily. 30 capsule 3   topiramate (TOPAMAX) 50 MG tablet Take once daily 90 tablet 3   traZODone (DESYREL) 100 MG tablet Take 1 tablet by mouth at bedtime.     meloxicam (MOBIC) 7.5 MG tablet Take 1 tablet PO twice a day for 2 weeks and then PRN (Patient not taking: Reported on 12/19/2021)     SUMAtriptan (IMITREX) 25 MG tablet TAKE 1 TABLET (25 MG TOTAL) BY MOUTH ONCE FOR 1 DOSE. MAY REPEAT IN 2 HOURS IF NEEDED 9 tablet 1   No current facility-administered medications on file prior to visit.    BP (!) 118/58   Pulse (!) 101   Temp 98.1 F (36.7 C) (Temporal)   Ht 5\' 10"  (1.778 m)   Wt 139 lb (63 kg)   SpO2 99%   BMI 19.94 kg/m  Objective:   Physical Exam HENT:     Right Ear: Tympanic membrane and ear canal normal.     Left Ear: Tympanic membrane and ear canal normal.  Eyes:     Extraocular Movements: Extraocular movements intact.  Cardiovascular:     Rate and Rhythm: Normal rate and regular rhythm.  Pulmonary:     Effort: Pulmonary effort is normal.     Breath sounds: Normal breath sounds.  Musculoskeletal:     Cervical back: Neck supple.  Skin:    General: Skin is warm and dry.  Neurological:     Mental Status: She is alert and oriented to person, place, and time.     Cranial Nerves: No cranial nerve deficit.  Psychiatric:        Mood and Affect: Mood normal.           Assessment & Plan:   Problem List Items Addressed This Visit       Other   Dizziness - Primary    Could be multifactorial as symptoms do somewhat represent vertigo, however she is bleeding quite heavily with her menstrual cycle.  Checking CBC and iron studies today. Start meclizine 12.5  mg 3 times daily as needed.  Repeat testing for trichomonas evaluation given positive result 1 month ago.  Return precautions provided.      Relevant Medications   meclizine (  ANTIVERT) 12.5 MG tablet   Other Relevant Orders   CBC   IBC + Ferritin   Trichomonas vaginalis (TV) infection    Incidental finding on Pap smear from early September 2023. Asymptomatic today.  Repeat trichomonas testing today. We will treat if positive.      Relevant Orders   Trichomonas vaginalis, RNA   Menorrhagia with regular cycle    Could certainly be contributing to dizziness and symptoms today.  CBC and iron studies pending today. Consider further work-up with transvaginal ultrasound if bleeding persists and is prolonged. Pap smear results reviewed with patient today.      Relevant Orders   CBC   IBC + Ferritin       Doreene Nest, NP

## 2021-12-20 ENCOUNTER — Other Ambulatory Visit: Payer: Self-pay | Admitting: Primary Care

## 2021-12-20 DIAGNOSIS — A5901 Trichomonal vulvovaginitis: Secondary | ICD-10-CM

## 2021-12-20 LAB — TRICHOMONAS VAGINALIS, PROBE AMP: Trichomonas vaginalis RNA: DETECTED — AB

## 2021-12-20 MED ORDER — METRONIDAZOLE 500 MG PO TABS: 500.0000 mg | ORAL_TABLET | Freq: Two times a day (BID) | ORAL | 0 refills | Status: AC

## 2021-12-27 ENCOUNTER — Telehealth: Payer: Self-pay

## 2021-12-27 ENCOUNTER — Emergency Department (HOSPITAL_COMMUNITY)
Admission: EM | Admit: 2021-12-27 | Discharge: 2021-12-27 | Disposition: A | Discharge status: Home / Self Care | Payer: Commercial Managed Care - HMO | Attending: Emergency Medicine | Admitting: Emergency Medicine

## 2021-12-27 ENCOUNTER — Emergency Department (HOSPITAL_COMMUNITY): Payer: Commercial Managed Care - HMO

## 2021-12-27 ENCOUNTER — Encounter (HOSPITAL_COMMUNITY): Payer: Self-pay

## 2021-12-27 ENCOUNTER — Other Ambulatory Visit: Payer: Self-pay

## 2021-12-27 DIAGNOSIS — R1084 Generalized abdominal pain: Secondary | ICD-10-CM | POA: Diagnosis present

## 2021-12-27 DIAGNOSIS — R112 Nausea with vomiting, unspecified: Secondary | ICD-10-CM

## 2021-12-27 DIAGNOSIS — R072 Precordial pain: Secondary | ICD-10-CM | POA: Insufficient documentation

## 2021-12-27 DIAGNOSIS — R0789 Other chest pain: Secondary | ICD-10-CM

## 2021-12-27 DIAGNOSIS — N9489 Other specified conditions associated with female genital organs and menstrual cycle: Secondary | ICD-10-CM | POA: Diagnosis not present

## 2021-12-27 DIAGNOSIS — K5900 Constipation, unspecified: Secondary | ICD-10-CM | POA: Insufficient documentation

## 2021-12-27 HISTORY — DX: Dizziness and giddiness: R42

## 2021-12-27 LAB — BASIC METABOLIC PANEL
Anion gap: 4 — ABNORMAL LOW (ref 5–15)
BUN: 11 mg/dL (ref 6–20)
CO2: 23 mmol/L (ref 22–32)
Calcium: 8.9 mg/dL (ref 8.9–10.3)
Chloride: 111 mmol/L (ref 98–111)
Creatinine, Ser: 0.74 mg/dL (ref 0.44–1.00)
GFR, Estimated: >60 mL/min (ref >60)
Glucose, Bld: 88 mg/dL (ref 70–99)
Potassium: 3.8 mmol/L (ref 3.5–5.1)
Sodium: 138 mmol/L (ref 135–145)

## 2021-12-27 LAB — CBC
HCT: 39.3 % (ref 36.0–46.0)
Hemoglobin: 12.6 g/dL (ref 12.0–15.0)
MCH: 30.5 pg (ref 26.0–34.0)
MCHC: 32.1 g/dL (ref 30.0–36.0)
MCV: 95.2 fL (ref 80.0–100.0)
Platelets: 172 10*3/uL (ref 150–400)
RBC: 4.13 MIL/uL (ref 3.87–5.11)
RDW: 14.1 % (ref 11.5–15.5)
WBC: 8 10*3/uL (ref 4.0–10.5)
nRBC: 0 % (ref 0.0–0.2)

## 2021-12-27 LAB — TROPONIN I (HIGH SENSITIVITY)
Troponin I (High Sensitivity): <2 ng/L (ref <18)
Troponin I (High Sensitivity): <2 ng/L (ref <18)

## 2021-12-27 LAB — I-STAT BETA HCG BLOOD, ED (MC, WL, AP ONLY): I-stat hCG, quantitative: <5 m[IU]/mL (ref <5)

## 2021-12-27 MED ORDER — FENTANYL CITRATE PF 50 MCG/ML IJ SOSY
50.0000 ug | PREFILLED_SYRINGE | Freq: Once | INTRAMUSCULAR | Status: AC
  Administered 2021-12-27: 50 ug via INTRAVENOUS
  Filled 2021-12-27: qty 1

## 2021-12-27 MED ORDER — ONDANSETRON HCL 4 MG PO TABS: 4.0000 mg | ORAL_TABLET | Freq: Three times a day (TID) | ORAL | 0 refills | Status: AC | PRN

## 2021-12-27 MED ORDER — ONDANSETRON HCL 4 MG/2ML IJ SOLN
4.0000 mg | Freq: Once | INTRAMUSCULAR | Status: AC
  Administered 2021-12-27: 4 mg via INTRAVENOUS
  Filled 2021-12-27: qty 2

## 2021-12-27 NOTE — ED Triage Notes (Signed)
Patient c/o mid abdominal pain that radiates into the chest since last night. Patient states she vomited x 1 this AM. Patient denies SOB or diaphoresis.

## 2021-12-27 NOTE — ED Provider Notes (Signed)
Received handoff from Carloyn Jaeger PA, pending 2nd troponin and disposition.   Physical Exam  BP 106/80   Pulse 63   Temp 98.4 F (36.9 C) (Oral)   Resp 15   Ht 5\' 10"  (1.778 m)   Wt 65.8 kg   LMP 12/13/2021   SpO2 100%   BMI 20.81 kg/m   Physical Exam Vitals and nursing note reviewed.  Constitutional:      General: She is not in acute distress.    Appearance: She is well-developed.  HENT:     Head: Normocephalic and atraumatic.  Eyes:     Conjunctiva/sclera: Conjunctivae normal.  Cardiovascular:     Rate and Rhythm: Normal rate and regular rhythm.     Heart sounds: No murmur heard. Pulmonary:     Effort: Pulmonary effort is normal. No respiratory distress.     Breath sounds: Normal breath sounds.  Chest:     Comments: +TTP substernal chest wall Abdominal:     Palpations: Abdomen is soft.     Tenderness: There is no abdominal tenderness.  Musculoskeletal:        General: No swelling.     Cervical back: Neck supple.  Skin:    General: Skin is warm and dry.     Capillary Refill: Capillary refill takes less than 2 seconds.  Neurological:     Mental Status: She is alert.  Psychiatric:        Mood and Affect: Mood normal.     Procedures  Procedures  ED Course / MDM   Clinical Course as of 12/27/21 1608  Thu Dec 27, 2021  1603 Troponin I (High Sensitivity): <2 [BS]    Clinical Course User Index [BS] Adeleine Pask, Si Gaul, PA   Medical Decision Making Amount and/or Complexity of Data Reviewed Labs: ordered. Decision-making details documented in ED Course. Radiology: ordered.  Risk Prescription drug management.   Patient well-appearing, reports pain has resolved.  She does endorse having a smoker's cough, and when she coughs the chest discomfort is worse.  She is well-appearing, has nonlabored respirations, and states she feels better.  Is asking to go home.  And requesting work note, second troponin was unremarkable.  I went over all of the findings with  patient, and she was comfortable with discharge.  Return precautions emphasized.  Zofran sent to the pharmacy by previous provider.       Osvaldo Shipper, [place] 12/27/21 1609    Audley Hose, MD 12/27/21 [phone]

## 2021-12-27 NOTE — Chronic Care Management (AMB) (Signed)
  Care Coordination  Outreach Note  12/27/2021 Name: Lacey Miles MRN: [id number] DOB: 1975-08-09   Care Coordination Outreach Attempts: An unsuccessful telephone outreach was attempted today to offer the patient information about available care coordination services as a benefit of their health plan.   Follow Up Plan:  Additional outreach attempts will be made to offer the patient care coordination information and services.   Encounter Outcome:  No Answer  Sig Noreene Larsson, [place], [place] [address] Direct Dial: [phone] [email or web address]

## 2021-12-27 NOTE — Discharge Instructions (Addendum)
You were seen today for nausea, vomiting, and chest pain. Your workup was reassuring for no signs of acute coronary syndrome or "heart attack". I have prescribed a medication called Zofran to help with nausea. Please take as directed. Follow up with your primary care provider as needed.  If you develop any life-threatening symptoms such as shortness of breath or worsening chest pain, please return to the emergency department for reevaluation

## 2021-12-27 NOTE — ED Provider Notes (Signed)
[facility] DEPT Provider Note   CSN: [id number] Arrival date & time: 12/27/21  7782     History  Chief Complaint  Patient presents with   Abdominal Pain    Chest pain   Chest Pain    Lacey Miles is a 46 y.o. female.  Presents to the hospital complaining of generalized abdominal pain with nausea and vomiting and substernal chest pain.  Patient states that she had abdominal pain starting last night.  She states at work this morning she vomited 1 time.  She is after vomiting she felt some chest pain in the center of her chest.  She denies any radiation of symptoms.  Denies shortness of breath.  Rates the pain as mild but is worse with palpation.  She endorses very mild constipation with her last bowel movement being yesterday.  Past medical history significant for seizures, anxiety, vertigo (on meclizine), depression  HPI     Home Medications Prior to Admission medications   Medication Sig Start Date End Date Taking? Authorizing Provider  ondansetron (ZOFRAN) 4 MG tablet Take 1 tablet (4 mg total) by mouth every 8 (eight) hours as needed for nausea or vomiting. 12/27/21  Yes Cherlynn June B, PA-C  divalproex (DEPAKOTE ER) 500 MG 24 hr tablet Take 1 tablet twice a day 11/14/21   Cameron Sprang, MD  levETIRAcetam (KEPPRA) 750 MG tablet Take 1 tablet (750 mg total) by mouth 2 (two) times daily. 11/14/21   Cameron Sprang, MD  meclizine (ANTIVERT) 12.5 MG tablet Take 1 tablet (12.5 mg total) by mouth 3 (three) times daily as needed for dizziness. 12/19/21   Pleas Koch, NP  meloxicam (MOBIC) 7.5 MG tablet Take 1 tablet PO twice a day for 2 weeks and then PRN Patient not taking: Reported on 12/19/2021 10/24/21   [provider]  metroNIDAZOLE (FLAGYL) 500 MG tablet Take 1 tablet (500 mg total) by mouth 2 (two) times daily for 7 days. 12/20/21 12/27/21  Pleas Koch, NP  omeprazole (PRILOSEC) 20 MG capsule Take 1 capsule (20 mg  total) by mouth daily. 07/27/21   Eugenia Pancoast, FNP  SUMAtriptan (IMITREX) 25 MG tablet TAKE 1 TABLET (25 MG TOTAL) BY MOUTH ONCE FOR 1 DOSE. MAY REPEAT IN 2 HOURS IF NEEDED 10/03/21 11/21/21  Michela Pitcher, NP  topiramate (TOPAMAX) 50 MG tablet Take once daily 11/14/21   Cameron Sprang, MD  traZODone (DESYREL) 100 MG tablet Take 1 tablet by mouth at bedtime.    [provider]      Allergies    Minocycline, Tomato, Trazodone and nefazodone, and Tramadol    Review of Systems   Review of Systems  Respiratory:  Negative for shortness of breath.   Cardiovascular:  Positive for chest pain.  Gastrointestinal:  Positive for abdominal pain, nausea and vomiting.  Genitourinary:  Negative for dysuria.  Neurological:  Negative for dizziness.    Physical Exam Updated Vital Signs BP 106/80   Pulse 63   Temp 98.4 F (36.9 C) (Oral)   Resp 15   Ht 5\' 10"  (1.778 m)   Wt 65.8 kg   LMP 12/13/2021   SpO2 100%   BMI 20.81 kg/m  Physical Exam Vitals and nursing note reviewed.  Constitutional:      General: She is not in acute distress.    Appearance: She is well-developed.  HENT:     Head: Normocephalic and atraumatic.  Eyes:     Conjunctiva/sclera: Conjunctivae normal.  Cardiovascular:     Rate and Rhythm: Normal rate and regular rhythm.     Heart sounds: No murmur heard. Pulmonary:     Effort: Pulmonary effort is normal. No respiratory distress.     Breath sounds: Normal breath sounds.  Abdominal:     Palpations: Abdomen is soft.     Tenderness: There is no abdominal tenderness.  Musculoskeletal:        General: Tenderness present. No swelling.     Cervical back: Neck supple.     Comments: Tenderness to palpation of the left side of the sternum  Skin:    General: Skin is warm and dry.     Capillary Refill: Capillary refill takes less than 2 seconds.  Neurological:     Mental Status: She is alert.  Psychiatric:        Mood and Affect: Mood normal.     ED Results /  Procedures / Treatments   Labs (all labs ordered are listed, but only abnormal results are displayed) Labs Reviewed  BASIC METABOLIC PANEL - Abnormal; Notable for the following components:      Result Value   Anion gap 4 (*)    All other components within normal limits  CBC  I-STAT BETA HCG BLOOD, ED ([facility], [facility], [facility] ONLY)  TROPONIN I (HIGH SENSITIVITY)  TROPONIN I (HIGH SENSITIVITY)    EKG None  Radiology DG Chest 2 View  Result Date: 12/27/2021 CLINICAL DATA:  Chest pain. EXAM: CHEST - 2 VIEW COMPARISON:  None Available. FINDINGS: No pleural effusion. No pneumothorax. No focal airspace opacity. Normal cardiac and mediastinal contours. No displaced rib fractures. Visualized upper abdomen is unremarkable. IMPRESSION: No active cardiopulmonary disease. Electronically Signed   By: Lorenza Cambridge M.D.   On: 12/27/2021 10:28    Procedures Procedures    Medications Ordered in ED Medications  fentaNYL (SUBLIMAZE) injection 50 mcg (50 mcg Intravenous Given 12/27/21 1141)  ondansetron (ZOFRAN) injection 4 mg (4 mg Intravenous Given 12/27/21 1140)    ED Course/ Medical Decision Making/ A&P                           Medical Decision Making Amount and/or Complexity of Data Reviewed Labs: ordered. Radiology: ordered.  Risk Prescription drug management.   This patient presents to the ED for concern of abdominal pain and chest pain, this involves an extensive number of treatment options, and is a complaint that carries with it a high risk of complications and morbidity.  The differential diagnosis includes ACS, PE, gastroenteritis, musculoskeletal pain, dissection, others   Co morbidities that complicate the patient evaluation  History of anxiety   Additional history obtained:   External records from outside source obtained and reviewed including family medicine notes from a visit on 12/19/21 for dizziness with a diagnosis of vertigo   Lab Tests:  I Ordered, and personally  interpreted labs.  The pertinent results include: Unremarkable CBC, BMP; negative i-STAT beta-hCG pregnancy test; troponin less than 2   Imaging Studies ordered:  I ordered imaging studies including chest x-ray  I independently visualized and interpreted imaging which showed no acute cardiopulmonary disease I agree with the radiologist interpretation   Cardiac Monitoring: / EKG:  The patient was maintained on a cardiac monitor.  I personally viewed and interpreted the cardiac monitored which showed an underlying rhythm of: sinus rhythm   Problem List / ED Course / Critical interventions / Medication management   I ordered medication  including fentanyl for pain, Zofran for nausea Reevaluation of the patient after these medicines showed that the patient improved I have reviewed the patients home medicines and have made adjustments as needed   Test / Admission - Considered:  Plan to discharge patient home pending results of second troponin.  Patient has no abdominal tenderness to necessitate imaging at this time.  Feel that her abdominal pain and vomiting are likely infectious in origin.  Patient will be prescribed Zofran for nausea.  Patient's chest is tender to palpation and seems likely to be costochondritis.  Patient does have remote history of MI when she was younger and I feel that it is wise at this time to check for second troponin.  If second troponin is negative patient may discharge home. Care transferred to [facility], PA-C at shift handoff.         Final Clinical Impression(s) / ED Diagnoses Final diagnoses:  Chest wall pain  Generalized abdominal pain  Nausea and vomiting, unspecified vomiting type    Rx / DC Orders ED Discharge Orders          Ordered    ondansetron (ZOFRAN) 4 MG tablet  Every 8 hours PRN        12/27/21 1523              Pamala Duffel 12/27/21 1528    Glyn Ade, MD 12/28/21 [phone]

## 2021-12-28 ENCOUNTER — Telehealth: Payer: Self-pay | Admitting: *Deleted

## 2021-12-28 NOTE — Patient Outreach (Signed)
  Care Coordination   12/28/2021 Name: Lacey Miles MRN: [id number] DOB: 11/24/1975   Care Coordination Outreach Attempts:  An unsuccessful telephone outreach was attempted today to offer the patient information about available care coordination services as a benefit of their health plan.   Follow Up Plan:  Additional outreach attempts will be made to offer the patient care coordination information and services.   Encounter Outcome:  No Answer  Care Coordination Interventions Activated:  No   Care Coordination Interventions:  No, not indicated    Shealyn Sean, [place] Worker  [facility] Care Management [phone]

## 2022-01-02 ENCOUNTER — Ambulatory Visit (HOSPITAL_COMMUNITY): Payer: Commercial Managed Care - HMO

## 2022-01-03 ENCOUNTER — Ambulatory Visit (INDEPENDENT_AMBULATORY_CARE_PROVIDER_SITE_OTHER): Payer: Commercial Managed Care - HMO | Admitting: Family Medicine

## 2022-01-03 ENCOUNTER — Encounter: Payer: Self-pay | Admitting: Family Medicine

## 2022-01-03 ENCOUNTER — Ambulatory Visit
Admission: RE | Admit: 2022-01-03 | Discharge: 2022-01-03 | Disposition: A | Discharge status: Home / Self Care | Payer: Commercial Managed Care - HMO | Source: Ambulatory Visit | Attending: Family Medicine | Admitting: Family Medicine

## 2022-01-03 ENCOUNTER — Telehealth: Payer: Self-pay

## 2022-01-03 VITALS — BP 110/80 | HR 69 | Temp 98.0°F | Ht 70.0 in | Wt 144.5 lb

## 2022-01-03 DIAGNOSIS — R109 Unspecified abdominal pain: Secondary | ICD-10-CM

## 2022-01-03 DIAGNOSIS — R1031 Right lower quadrant pain: Secondary | ICD-10-CM

## 2022-01-03 DIAGNOSIS — R12 Heartburn: Secondary | ICD-10-CM | POA: Diagnosis not present

## 2022-01-03 MED ORDER — OMEPRAZOLE 20 MG PO CPDR: 20.0000 mg | DELAYED_RELEASE_CAPSULE | Freq: Two times a day (BID) | ORAL | 2 refills | Status: AC

## 2022-01-03 MED ORDER — IOPAMIDOL (ISOVUE-300) INJECTION 61%
100.0000 mL | Freq: Once | INTRAVENOUS | Status: AC | PRN
  Administered 2022-01-03: 100 mL via INTRAVENOUS

## 2022-01-03 NOTE — Patient Instructions (Addendum)
Stop your MELOXICAM  Be sure to take your OMEPRAZOLE twice a day right now.
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
## 2022-01-03 NOTE — Progress Notes (Signed)
Karl Ito, NP,   I received a pharmacy referral for Ms. Navia last week for medication assistance. After 3 unsuccessful outreach attempts, I will now be closing her case.   [facility] pharmacy case is being closed due to the following reasons:  -Will close [place] case as I have been unable to engage or maintain contact with patient  Reason for referral: Medication assistance with seizure medications   [place] is happy to assist the patient/family in the future for clinical pharmacy needs, following a discussion from your team about [place] outreach. Thank you for allowing [facility] to be a part of your patient's care.   Thanks,  Reed Breech, [place] [phone]

## 2022-01-04 ENCOUNTER — Ambulatory Visit: Payer: Self-pay | Admitting: *Deleted

## 2022-01-04 ENCOUNTER — Telehealth: Payer: Self-pay | Admitting: *Deleted

## 2022-01-04 NOTE — Patient Outreach (Signed)
  Care Coordination   01/04/2022 Name: Lacey Miles MRN: [id number] DOB: 04-Aug-1975   Care Coordination Outreach Attempts:  A second unsuccessful outreach was attempted today to offer the patient with information about available care coordination services as a benefit of their health plan.     Follow Up Plan:  Additional outreach attempts will be made to offer the patient care coordination information and services.   Encounter Outcome:  No Answer  Care Coordination Interventions Activated:  No   Care Coordination Interventions:  No, not indicated    Evanne Matsunaga, [place] Worker  [facility] Care Management [phone]
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
## 2022-01-09 ENCOUNTER — Telehealth: Payer: Self-pay | Admitting: *Deleted

## 2022-01-09 ENCOUNTER — Encounter: Payer: Self-pay | Admitting: *Deleted

## 2022-01-09 NOTE — Patient Outreach (Signed)
  Care Coordination   01/09/2022 Name: Lacey Miles MRN: [id number] DOB: 01/12/1976   Care Coordination Outreach Attempts:  An unsuccessful telephone outreach was attempted for a scheduled appointment today.  Follow Up Plan:  Additional outreach attempts will be made to offer the patient care coordination information and services.   Encounter Outcome:  No Answer  Care Coordination Interventions Activated:  No   Care Coordination Interventions:  No, not indicated    Orby Tangen, [place] Worker  [facility] Care Management [phone]

## 2022-01-10 ENCOUNTER — Encounter (HOSPITAL_COMMUNITY): Payer: Self-pay | Admitting: Emergency Medicine

## 2022-01-10 ENCOUNTER — Emergency Department (HOSPITAL_COMMUNITY)
Admission: EM | Admit: 2022-01-10 | Discharge: 2022-01-10 | Discharge status: Against Medical Advice | Payer: Commercial Managed Care - HMO | Attending: Emergency Medicine | Admitting: Emergency Medicine

## 2022-01-10 ENCOUNTER — Other Ambulatory Visit: Payer: Self-pay

## 2022-01-10 DIAGNOSIS — R109 Unspecified abdominal pain: Secondary | ICD-10-CM | POA: Diagnosis present

## 2022-01-10 DIAGNOSIS — Z5321 Procedure and treatment not carried out due to patient leaving prior to being seen by health care provider: Secondary | ICD-10-CM | POA: Diagnosis not present

## 2022-01-10 LAB — URINALYSIS, ROUTINE W REFLEX MICROSCOPIC
Bilirubin Urine: NEGATIVE
Glucose, UA: NEGATIVE mg/dL
Ketones, ur: NEGATIVE mg/dL
Leukocytes,Ua: NEGATIVE
Nitrite: NEGATIVE
Protein, ur: NEGATIVE mg/dL
RBC / HPF: >50 RBC/hpf — ABNORMAL HIGH (ref 0–5)
Specific Gravity, Urine: 1.004 — ABNORMAL LOW (ref 1.005–1.030)
pH: 8 (ref 5.0–8.0)

## 2022-01-10 NOTE — ED Triage Notes (Signed)
Pt reports severe mid abd pain x 1 month. Pt reorts having CT scan done at PCP w/ no findings. Pt reports she takes zofran for nausea. Denies vomiting/diarrhea. Pain 9/10.

## 2022-02-12 ENCOUNTER — Ambulatory Visit: Payer: Commercial Managed Care - HMO | Admitting: Neurology

## 2022-02-12 ENCOUNTER — Encounter: Payer: Self-pay | Admitting: Neurology

## 2022-02-12 DIAGNOSIS — Z029 Encounter for administrative examinations, unspecified: Secondary | ICD-10-CM

## 2022-04-18 DIAGNOSIS — F411 Generalized anxiety disorder: Secondary | ICD-10-CM | POA: Diagnosis not present

## 2022-04-25 DIAGNOSIS — F4310 Post-traumatic stress disorder, unspecified: Secondary | ICD-10-CM | POA: Diagnosis not present

## 2022-04-25 DIAGNOSIS — F4323 Adjustment disorder with mixed anxiety and depressed mood: Secondary | ICD-10-CM | POA: Diagnosis not present

## 2022-04-25 DIAGNOSIS — F1910 Other psychoactive substance abuse, uncomplicated: Secondary | ICD-10-CM | POA: Diagnosis not present

## 2022-05-02 DIAGNOSIS — M7989 Other specified soft tissue disorders: Secondary | ICD-10-CM | POA: Diagnosis not present

## 2022-05-02 DIAGNOSIS — F4323 Adjustment disorder with mixed anxiety and depressed mood: Secondary | ICD-10-CM | POA: Diagnosis not present

## 2022-05-02 DIAGNOSIS — R079 Chest pain, unspecified: Secondary | ICD-10-CM | POA: Diagnosis not present

## 2022-05-02 DIAGNOSIS — F17210 Nicotine dependence, cigarettes, uncomplicated: Secondary | ICD-10-CM | POA: Diagnosis not present

## 2022-05-02 DIAGNOSIS — E785 Hyperlipidemia, unspecified: Secondary | ICD-10-CM | POA: Diagnosis not present

## 2022-05-02 DIAGNOSIS — R002 Palpitations: Secondary | ICD-10-CM | POA: Diagnosis not present

## 2022-05-02 DIAGNOSIS — F4310 Post-traumatic stress disorder, unspecified: Secondary | ICD-10-CM | POA: Diagnosis not present

## 2022-05-02 DIAGNOSIS — Z885 Allergy status to narcotic agent status: Secondary | ICD-10-CM | POA: Diagnosis not present

## 2022-05-02 DIAGNOSIS — R2243 Localized swelling, mass and lump, lower limb, bilateral: Secondary | ICD-10-CM | POA: Diagnosis not present

## 2022-05-02 DIAGNOSIS — M79672 Pain in left foot: Secondary | ICD-10-CM | POA: Diagnosis not present

## 2022-05-02 DIAGNOSIS — Z888 Allergy status to other drugs, medicaments and biological substances status: Secondary | ICD-10-CM | POA: Diagnosis not present

## 2022-05-02 DIAGNOSIS — F1910 Other psychoactive substance abuse, uncomplicated: Secondary | ICD-10-CM | POA: Diagnosis not present

## 2022-05-02 DIAGNOSIS — R0789 Other chest pain: Secondary | ICD-10-CM | POA: Diagnosis not present

## 2022-05-09 DIAGNOSIS — F4312 Post-traumatic stress disorder, chronic: Secondary | ICD-10-CM | POA: Diagnosis not present

## 2022-05-09 DIAGNOSIS — F411 Generalized anxiety disorder: Secondary | ICD-10-CM | POA: Diagnosis not present

## 2022-05-30 DIAGNOSIS — A5900 Urogenital trichomoniasis, unspecified: Secondary | ICD-10-CM | POA: Diagnosis not present

## 2022-06-03 DIAGNOSIS — F1420 Cocaine dependence, uncomplicated: Secondary | ICD-10-CM | POA: Diagnosis not present

## 2022-09-23 ENCOUNTER — Telehealth: Payer: Self-pay

## 2022-09-23 NOTE — Transitions of Care (Post Inpatient/ED Visit) (Signed)
Unable to reach pt by phone and no v/m available at this time.      09/23/2022  Name: Lacey Miles MRN: [id number] DOB: 1975-06-02  Today's TOC FU Call Status: Today's TOC FU Call Status:: Unsuccessul Call (1st Attempt) Unsuccessful Call (1st Attempt) Date: 09/23/22  Attempted to reach the patient regarding the most recent Inpatient/ED visit.  Follow Up Plan: Additional outreach attempts will be made to reach the patient to complete the Transitions of Care (Post Inpatient/ED visit) call.   Signature  Lewanda Rife, LPN

## 2022-09-24 NOTE — Transitions of Care (Post Inpatient/ED Visit) (Signed)
No answer and v/m not set up yet.     09/24/2022  Name: Lacey Miles MRN: [id number] DOB: 09/21/1975  Today's TOC FU Call Status: Today's TOC FU Call Status:: Unsuccessful Call (2nd Attempt) Unsuccessful Call (1st Attempt) Date: 09/23/22 Unsuccessful Call (2nd Attempt) Date: 09/24/22 (no answer and v/m not set up.)  Attempted to reach the patient regarding the most recent Inpatient/ED visit.  Follow Up Plan: Additional outreach attempts will be made to reach the patient to complete the Transitions of Care (Post Inpatient/ED visit) call.   Signature Lewanda Rife, LPN

## 2022-09-25 NOTE — Transitions of Care (Post Inpatient/ED Visit) (Signed)
Unable to reach pt by phone and v/m not set up yet.      09/25/2022  Name: Lacey Miles MRN: [id number] DOB: 03-04-1976  Today's TOC FU Call Status: Today's TOC FU Call Status:: Unsuccessful Call (3rd Attempt) Unsuccessful Call (1st Attempt) Date: 09/23/22 Unsuccessful Call (2nd Attempt) Date: 09/24/22 (no answer and v/m not set up.) Unsuccessful Call (3rd Attempt) Date: 09/25/22  Attempted to reach the patient regarding the most recent Inpatient/ED visit.  Follow Up Plan: Additional outreach attempts will be made to reach the patient to complete the Transitions of Care (Post Inpatient/ED visit) call.   Signature Lewanda Rife, LPN

## 2022-10-28 ENCOUNTER — Telehealth: Payer: Self-pay

## 2022-10-28 NOTE — Transitions of Care (Post Inpatient/ED Visit) (Signed)
   10/28/2022  Name: Lacey Miles MRN: [id number] DOB: 1975/10/04  Today's TOC FU Call Status: Today's TOC FU Call Status:: Unsuccessful Call (1st Attempt) Unsuccessful Call (1st Attempt) Date: 10/28/22  Attempted to reach the patient regarding the most recent Inpatient/ED visit.  Follow Up Plan: Additional outreach attempts will be made to reach the patient to complete the Transitions of Care (Post Inpatient/ED visit) call.   Signature Valentino Nose, RN

## 2023-12-24 IMAGING — DX DG ANKLE COMPLETE 3+V*L*
3 series · 3 of 3 positions shown · non-contrast
Comparison: None Available.

CLINICAL DATA: Left ankle pain and injury. Fell and twisted ankle
08/09/2021. Lateral and posterior pain.

EXAM:
LEFT ANKLE COMPLETE - 3+ VIEW

[ankle ap]
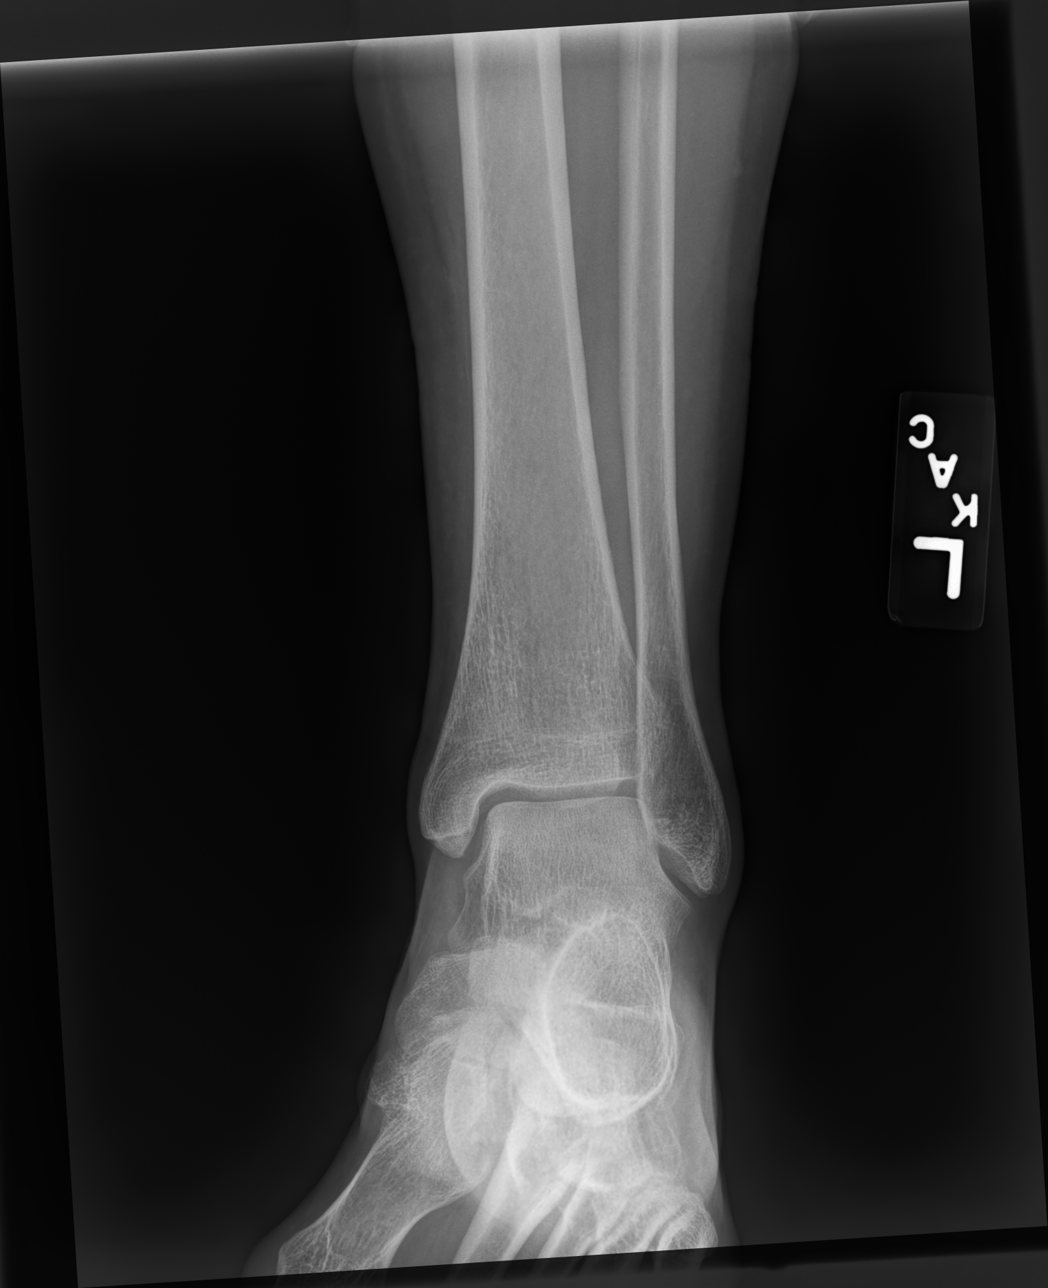

[ankle mlo]
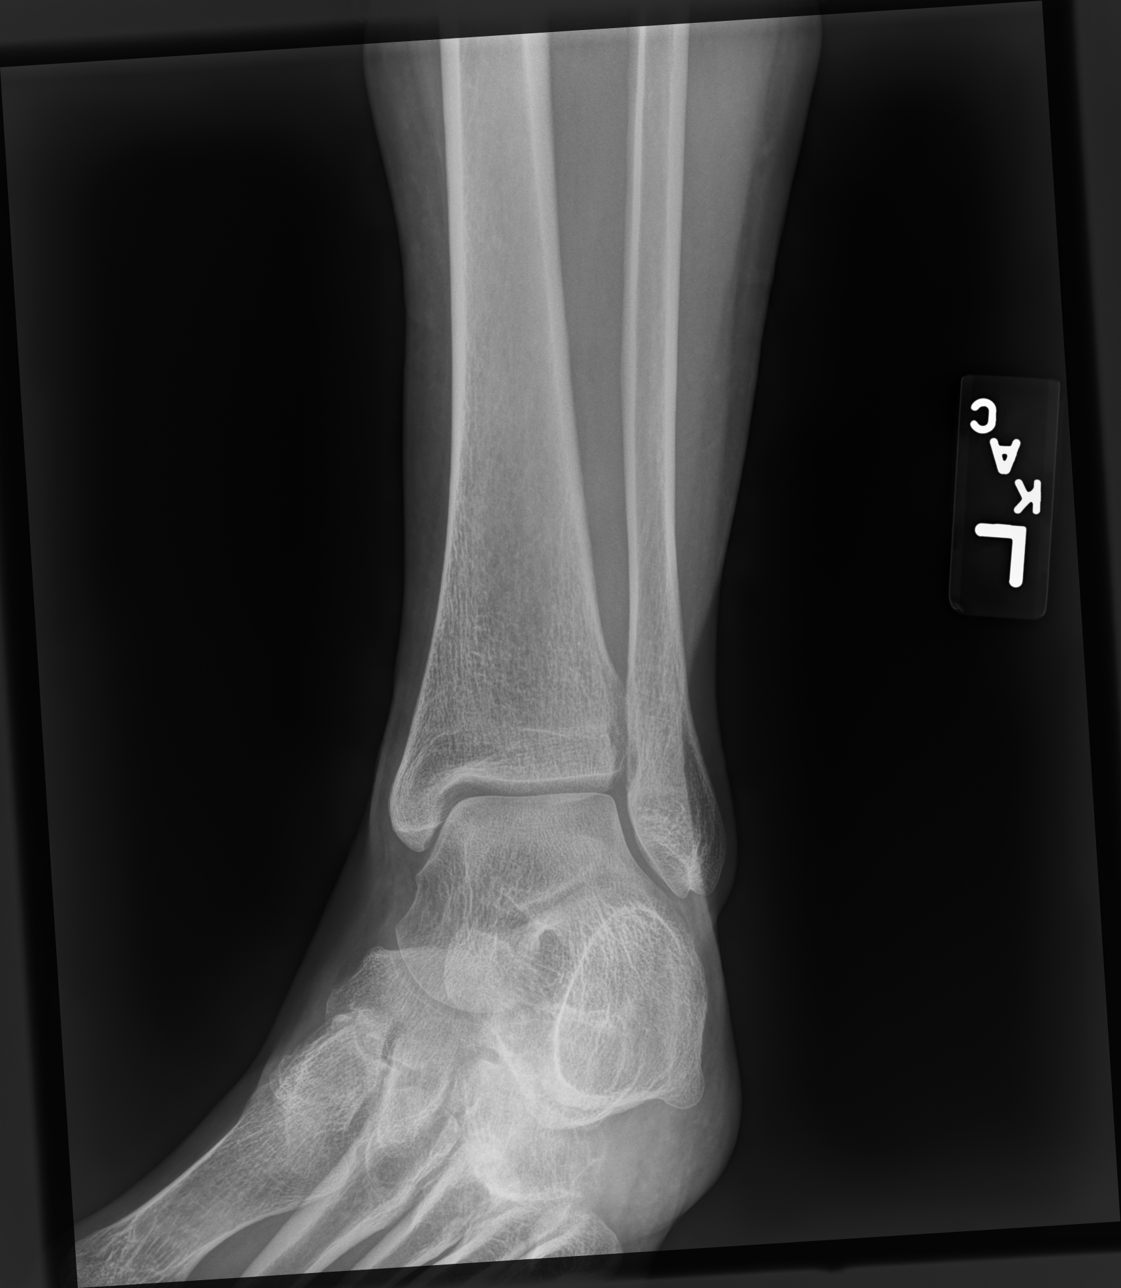

[ankle lat]
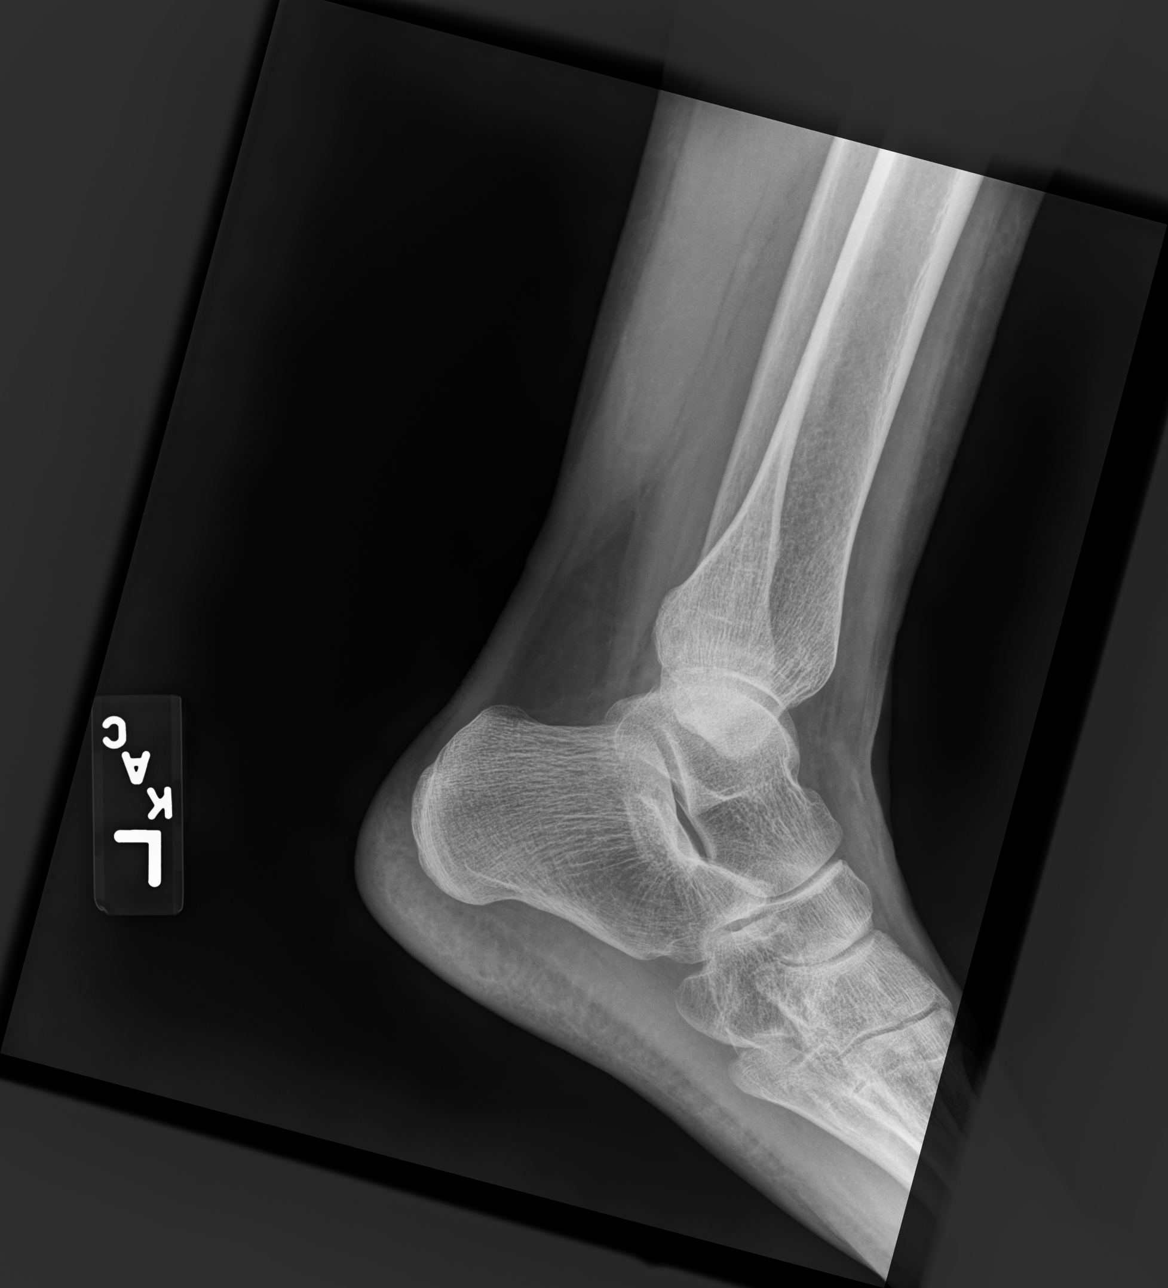

[3 of 3 positions shown; findings below may reference images not displayed]

FINDINGS: Ankle mortise is symmetric and intact. Normal bone mineralization.
No soft tissue swelling. Joint space is preserved. No acute fracture
or dislocation.
IMPRESSION: Normal left ankle radiographs.
